# Patient Record
Sex: Female | Born: 1990 | Race: White | Hispanic: No | Smoking: Current every day smoker
Health system: Southern US, Community
[De-identification: ages and names within clinical notes are randomized; demographics above are authoritative.]

## PROBLEM LIST (undated history)

## (undated) DIAGNOSIS — F3281 Premenstrual dysphoric disorder: Secondary | ICD-10-CM

## (undated) DIAGNOSIS — F419 Anxiety disorder, unspecified: Secondary | ICD-10-CM

## (undated) DIAGNOSIS — F4310 Post-traumatic stress disorder, unspecified: Secondary | ICD-10-CM

## (undated) DIAGNOSIS — F3110 Bipolar disorder, current episode manic without psychotic features, unspecified: Secondary | ICD-10-CM

## (undated) DIAGNOSIS — A630 Anogenital (venereal) warts: Secondary | ICD-10-CM

## (undated) DIAGNOSIS — F1910 Other psychoactive substance abuse, uncomplicated: Secondary | ICD-10-CM

## (undated) DIAGNOSIS — F329 Major depressive disorder, single episode, unspecified: Secondary | ICD-10-CM

## (undated) DIAGNOSIS — F32A Depression, unspecified: Secondary | ICD-10-CM

## (undated) DIAGNOSIS — N390 Urinary tract infection, site not specified: Secondary | ICD-10-CM

## (undated) DIAGNOSIS — F5000 Anorexia nervosa, unspecified: Secondary | ICD-10-CM

## (undated) HISTORY — DX: Anxiety disorder, unspecified: F41.9

## (undated) HISTORY — DX: Major depressive disorder, single episode, unspecified: F32.9

## (undated) HISTORY — DX: Premenstrual dysphoric disorder: F32.81

## (undated) HISTORY — DX: Other psychoactive substance abuse, uncomplicated: F19.10

## (undated) HISTORY — DX: Depression, unspecified: F32.A

## (undated) HISTORY — DX: Anorexia nervosa, unspecified: F50.00

## (undated) HISTORY — DX: Bipolar disorder, current episode manic without psychotic features, unspecified: F31.10

## (undated) HISTORY — DX: Anogenital (venereal) warts: A63.0

## (undated) HISTORY — DX: Urinary tract infection, site not specified: N39.0

## (undated) HISTORY — DX: Post-traumatic stress disorder, unspecified: F43.10

## (undated) HISTORY — PX: APPENDECTOMY: SHX54

---

## 2010-05-23 ENCOUNTER — Emergency Department (HOSPITAL_COMMUNITY): Payer: PPO

## 2010-05-23 ENCOUNTER — Other Ambulatory Visit: Payer: Self-pay | Admitting: General Surgery

## 2010-05-23 ENCOUNTER — Ambulatory Visit (HOSPITAL_COMMUNITY)
Admission: EM | Admit: 2010-05-23 | Discharge: 2010-05-24 | Disposition: A | Discharge status: Home / Self Care | Payer: PPO | Attending: Surgery | Admitting: Surgery

## 2010-05-23 DIAGNOSIS — E876 Hypokalemia: Secondary | ICD-10-CM | POA: Insufficient documentation

## 2010-05-23 DIAGNOSIS — R1031 Right lower quadrant pain: Secondary | ICD-10-CM | POA: Insufficient documentation

## 2010-05-23 DIAGNOSIS — Z01812 Encounter for preprocedural laboratory examination: Secondary | ICD-10-CM | POA: Insufficient documentation

## 2010-05-23 DIAGNOSIS — K3580 Unspecified acute appendicitis: Secondary | ICD-10-CM | POA: Insufficient documentation

## 2010-05-23 DIAGNOSIS — F17200 Nicotine dependence, unspecified, uncomplicated: Secondary | ICD-10-CM | POA: Insufficient documentation

## 2010-05-23 DIAGNOSIS — K219 Gastro-esophageal reflux disease without esophagitis: Secondary | ICD-10-CM | POA: Insufficient documentation

## 2010-05-23 LAB — CBC
HCT: 39.6 % (ref 36.0–46.0)
Hemoglobin: 13.6 g/dL (ref 12.0–15.0)
MCHC: 34.3 g/dL (ref 30.0–36.0)
MCV: 90.8 fL (ref 78.0–100.0)
RDW: 11.9 % (ref 11.5–15.5)
WBC: 11.3 10*3/uL — ABNORMAL HIGH (ref 4.0–10.5)

## 2010-05-23 LAB — COMPREHENSIVE METABOLIC PANEL
ALT: 12 U/L (ref 0–35)
AST: 18 U/L (ref 0–37)
Alkaline Phosphatase: 64 U/L (ref 39–117)
CO2: 29 mEq/L (ref 19–32)
Calcium: 10 mg/dL (ref 8.4–10.5)
Chloride: 99 mEq/L (ref 96–112)
GFR calc non Af Amer: >60 mL/min (ref >60)
Glucose, Bld: 103 mg/dL — ABNORMAL HIGH (ref 70–99)
Sodium: 138 mEq/L (ref 135–145)
Total Bilirubin: 0.2 mg/dL — ABNORMAL LOW (ref 0.3–1.2)

## 2010-05-23 LAB — DIFFERENTIAL
Eosinophils Relative: 1 % (ref 0–5)
Lymphocytes Relative: 12 % (ref 12–46)
Lymphs Abs: 1.4 10*3/uL (ref 0.7–4.0)
Monocytes Absolute: 0.6 10*3/uL (ref 0.1–1.0)

## 2010-05-23 LAB — URINALYSIS, ROUTINE W REFLEX MICROSCOPIC
Bilirubin Urine: NEGATIVE
Glucose, UA: NEGATIVE mg/dL
Hgb urine dipstick: NEGATIVE
Specific Gravity, Urine: 1.02 (ref 1.005–1.030)
Urobilinogen, UA: 0.2 mg/dL (ref 0.0–1.0)

## 2010-05-23 MED ORDER — IOHEXOL 300 MG/ML  SOLN
100.0000 mL | Freq: Once | INTRAMUSCULAR | Status: AC | PRN
  Administered 2010-05-23: 100 mL via INTRAVENOUS

## 2010-05-29 NOTE — H&P (Signed)
NAME:  Tonya Le, Tonya Le            ACCOUNT NO.:  [id number]  MEDICAL RECORD NO.:  [id number]           PATIENT TYPE:  O  LOCATION:  1537                         FACILITY:  [facility]  PHYSICIAN:  Angelia Mould. Derrell Lolling, M.D.DATE OF BIRTH:  [birth year]  DATE OF ADMISSION:  05/23/2010 DATE OF DISCHARGE:                             HISTORY & PHYSICAL   Primary care physician is in [place].  CHIEF COMPLAINT:  Right lower quadrant abdominal pain.  HISTORY OF PRESENT ILLNESS:  Tonya Le is a 20 year old white female who has a past medical history of gastroesophageal reflux disease as well as possible hypothyroidism who began having bilateral lower quadrant abdominal pain on Monday.  She describes this as a deep, annoying, sharp pain that has been constant since Monday.  It has progressively worsened over the last several days.  She did admit to some chills, but no fever.  She denies any nausea or vomiting and has had normal bowel movements.  Due to worsening abdominal pain, the patient presented to the emergency department today where she had further workup and evaluation.  She was found to have a white blood cell count of 11,300.  She had a CT scan of the abdomen and pelvis due to a localization of abdominal pain in the right lower quadrant that revealed acute appendicitis with a dilatation of the appendix at 1.2 cm with periappendiceal stranding.  Because of this finding, we were asked to evaluate the patient for surgical admission.  REVIEW OF SYSTEMS:  Please see HPI, otherwise all other systems have been reviewed and are negative.  FAMILY HISTORY:  Noncontributory.  PAST MEDICAL HISTORY: 1. GERD. 2. Possible hypothyroidism, not treated with medication.  PAST SURGICAL HISTORY:  None.  SOCIAL HISTORY:  The patient is a Consulting civil engineer at [employer].  She smokes a little bit less than a pack a day of cigarettes.  She admits to use an occasional marijuana, but denies any other  illicit drug abuse. She admits to drinking anywhere from 3-9 beers a day.  ALLERGIES:  NKDA.  MEDICATIONS:  Tonya Le birth control.  PHYSICAL EXAMINATION:  GENERAL:  Tonya Le is a very pleasant, well-developed, well-nourished 20 year old white female who is currently lying in bed in no acute distress. VITAL SIGNS:  Temperature 98.2, blood pressure 114/82, pulse 71, respirations 20. HEENT:  Head is normocephalic, atraumatic.  Sclerae noninjected.  Pupils are equal, round and reactive to light.  Ears and nose, without any obvious masses or lesions.  No rhinorrhea.  Mouth is pink.  Throat shows no exudate. HEART:  Regular rate and rhythm.  Normal S1, S2.  No murmurs, gallops, or rubs are noted.  She does have palpable carotid, radial, and pedal pulses bilaterally. LUNGS:  Clear to auscultation bilaterally with no wheezes, rhonchi, or rales noted. RESPIRATORY:  Effort is nonlabored. ABDOMEN:  Soft, tender in the right lower quadrant, otherwise nontender. She does have some voluntary guarding.  She does have active bowel sounds and is otherwise nondistended.  No masses, hernias, or organomegaly are noted.  MUSCULOSKELETAL:  All 4 extremities are symmetrical with no cyanosis, clubbing, or edema. SKIN:  Warm and dry with no  mass or lesions or rashes. PSYCH:  The patient is alert, oriented x3 with an appropriate affect.  LABORATORY DATA/DIAGNOSTICS:  White blood cell count of 11,300, hemoglobin 13.6, hematocrit 39.6, platelet count is 171,000.  Sodium 138, potassium 3.3, glucose 103, BUN 6, creatinine 0.59.  CT scan of the abdomen and pelvis reveals a 1.2 cm thickened appendix with periappendiceal stranding, but no definite phlegm on her abscess.  IMPRESSION: 1. Acute appendicitis. 2. Gastroesophageal reflux disease. 3. Possible hypothyroidism. 4. Hypokalemia.  PLAN:  At this time, we will get the patient admitted.  Dr. Derrell Lolling has evaluated the patient as well as reviewed  the CT scan.  It is felt the patient needs admission for IV antibiotics and IV fluids as well as taken to the operating room for a laparoscopic appendectomy.  We have explained this procedure to her as well as the risks and complications such as bleeding, infection, or risk of injury to structure not meant to injure.  The patient who is accompanied by her friend understands and is agreeable to proceed with surgical intervention.  We will replace the patient's potassium with potassium in her IV fluids.     Letha Cape, PA   ______________________________ Angelia Mould. Derrell Lolling, M.D.    KEO/MEDQ  D:  05/23/2010  T:  05/24/2010  Job:  [id number]  Electronically Signed by Barnetta Chapel PA on 05/28/2010 02:31:08 PM Electronically Signed by Claud Kelp M.D. on 05/29/2010 10:01:02 AM

## 2010-06-11 NOTE — Op Note (Signed)
NAME:  Tonya Le, Tonya Le            ACCOUNT NO.:  [id number]  MEDICAL RECORD NO.:  [id number]           PATIENT TYPE:  O  LOCATION:  1537                         FACILITY:  [facility]  PHYSICIAN:  Lennie Muckle, MD      DATE OF BIRTH:  March 08, 1990  DATE OF PROCEDURE:  05/23/2010 DATE OF DISCHARGE:                              OPERATIVE REPORT   PREOPERATIVE DIAGNOSIS:  Acute appendicitis.  POSTOPERATIVE DIAGNOSIS:  Acute appendicitis.  PROCEDURE:  Laparoscopic appendectomy.  ESTIMATED BLOOD LOSS:  Minimal.  ANESTHESIA:  General.  SPECIMENS:  Appendix.  FINDINGS:  Thickened appendix to the base.  There was no purulent fluid within the pelvis.  She also was noted to have a right inguinal hernia. No immediate complications.  INDICATIONS FOR PROCEDURE:  Ms. Zollinger is a 20 year old female who had onset of abdominal pain today.  She was evaluated in the emergency department and noted to have slightly elevated white count.  CT scan revealed thickened appendix consistent with acute appendicitis.  She is generally seen by Dr. Claud Kelp.  I saw the patient and agreed with the assessment and the need for appendectomy.  She was seen in the preoperative holding area.  She did receive Zosyn prior to going to the operating room.  DETAILS OF PROCEDURE:  After discussing the procedure with the patient and friends who were in the preoperative holding area, she was taken to the operating room by Anesthesia.  She was placed in the supine position.  A Foley catheter was placed.  Her abdomen was then prepped and draped in the usual sterile fashion.  Sequential compression devices had already been applied to her lower extremity.  Surgical time-out was performed, and I placed an incision in the right upper quadrant using a 5-mm camera, placed the trocar into the abdominal cavity.  All layers of the abdominal wall were visualized upon entry.  After insufflating the abdomen, I inspected the  abdomen and found no evidence of injury upon placement of the trocar.  I placed an additional 5-mm trocar at the umbilical region.  A 12-mm trocar was placed in the suprapubic region. The appendix was visualized in the right lower quadrant.  It was somewhat distended.  Using the harmonic scalpel, I divided the mesoappendix.  Once I divided the mesoappendix, I mobilized the cecum and the appendix medially by dissecting along the line of Toldt.  Once I mobilized the cecum and the appendix, I continued dissecting trying to get towards the base of the appendix.  The appendix was enlarged.  It was also thickened at the base.  I continued dissecting until I felt I was as close to the cecum as I could get without injury to the cecum.  I then placed a laparoscopic GIA stapler across the base of the appendix. I attempted to incorporate a small amount of the cecum.  Once I stapled across the appendix, I placed the EndoCatch bag.  I irrigated the abdomen with 2 liters of saline.  The staple line was intact.  I irrigated the pelvis.  I found a right inguinal hernia.  The ovaries were normal, uterus was normal,  liver and gallbladder were normal. After irrigating the abdomen, I closed the fascial defect at the suprapubic region using a 0 Vicryl suture.  I then did one final inspection, found no bleeding and no evidence of injury.  I then released pneumoperitoneum, removed the trocar.  Skin was closed with 4-0 Monocryl.  Dermabond was placed as final dressing.  The patient was extubated, transferred to postanesthesia care unit in stable condition to be discharged home tomorrow.     Lennie Muckle, MD     ALA/MEDQ  D:  05/23/2010  T:  05/24/2010  Job:  [id number]  Electronically Signed by Bertram Savin MD on 06/11/2010 10:41:58 AM

## 2014-07-11 ENCOUNTER — Encounter: Payer: Self-pay | Admitting: Family

## 2014-07-11 ENCOUNTER — Ambulatory Visit (INDEPENDENT_AMBULATORY_CARE_PROVIDER_SITE_OTHER): Payer: 59 | Admitting: Family

## 2014-07-11 VITALS — BP 124/88 | HR 93 | Temp 98.3°F | Resp 18 | Ht 63.5 in | Wt 132.8 lb

## 2014-07-11 DIAGNOSIS — F509 Eating disorder, unspecified: Secondary | ICD-10-CM | POA: Diagnosis not present

## 2014-07-11 DIAGNOSIS — Z3041 Encounter for surveillance of contraceptive pills: Secondary | ICD-10-CM | POA: Diagnosis not present

## 2014-07-11 DIAGNOSIS — F325 Major depressive disorder, single episode, in full remission: Secondary | ICD-10-CM | POA: Insufficient documentation

## 2014-07-11 DIAGNOSIS — F32A Depression, unspecified: Secondary | ICD-10-CM

## 2014-07-11 DIAGNOSIS — F329 Major depressive disorder, single episode, unspecified: Secondary | ICD-10-CM | POA: Diagnosis not present

## 2014-07-11 MED ORDER — DROSPIRENONE-ETHINYL ESTRADIOL 3-0.02 MG PO TABS: 1.0000 | ORAL_TABLET | Freq: Every day | ORAL | Status: DC

## 2014-07-11 NOTE — Assessment & Plan Note (Signed)
Previously maintained on drospiernone-ethynil estradiol. Takes her medication as prescribed and denies adverse side effects. Last menstrual cycle in middle of May. Has not missed any doses of birth control. Discussed other birth control methods including intrauterine device and Nexplanon. Continue current dosage of drospirenone-ethinyl estradiol.

## 2014-07-11 NOTE — Progress Notes (Signed)
Pre visit review using our clinic review tool, if applicable. No additional management support is needed unless otherwise documented below in the visit note. 

## 2014-07-11 NOTE — Assessment & Plan Note (Signed)
Stable and not currently maintained on medications. Denies any suicidal ideations. Indicates that her symptoms continue to wax and wane. Discussed potential medications, and would like to wait to discuss with psychiatry. She will investigate potential psychiatrists and notify the office for referral.

## 2014-07-11 NOTE — Patient Instructions (Addendum)
Thank you for choosing [facility].  Summary/Instructions:  Please continue to take your birth control as prescribed.  Please schedule a time for a physical at your convenience.  Please call with the name of a potential psychiatrist.   Your prescription(s) have been submitted to your pharmacy or been printed and provided for you. Please take as directed and contact our office if you believe you are having problem(s) with the medication(s) or have any questions.  Intrauterine Device Information An intrauterine device (IUD) is inserted into your uterus to prevent pregnancy. There are two types of IUDs available:   Copper IUD--This type of IUD is wrapped in copper wire and is placed inside the uterus. Copper makes the uterus and fallopian tubes produce a fluid that kills sperm. The copper IUD can stay in place for 10 years.  Hormone IUD--This type of IUD contains the hormone progestin (synthetic progesterone). The hormone thickens the cervical mucus and prevents sperm from entering the uterus. It also thins the uterine lining to prevent implantation of a fertilized egg. The hormone can weaken or kill the sperm that get into the uterus. One type of hormone IUD can stay in place for 5 years, and another type can stay in place for 3 years. Your health care provider will make sure you are a good candidate for a contraceptive IUD. Discuss with your health care provider the possible side effects.  ADVANTAGES OF AN INTRAUTERINE DEVICE  IUDs are highly effective, reversible, long acting, and low maintenance.   There are no estrogen-related side effects.   An IUD can be used when breastfeeding.   IUDs are not associated with weight gain.   The copper IUD works immediately after insertion.   The hormone IUD works right away if inserted within 7 days of your period starting. You will need to use a backup method of birth control for 7 days if the hormone IUD is inserted at any other time  in your cycle.  The copper IUD does not interfere with your female hormones.   The hormone IUD can make heavy menstrual periods lighter and decrease cramping.   The hormone IUD can be used for 3 or 5 years.   The copper IUD can be used for 10 years. DISADVANTAGES OF AN INTRAUTERINE DEVICE  The hormone IUD can be associated with irregular bleeding patterns.   The copper IUD can make your menstrual flow heavier and more painful.   You may experience cramping and vaginal bleeding after insertion.  Document Released: 12/26/2003 Document Revised: 09/23/2012 Document Reviewed: 07/12/2012 [facility] Patient Information 2015 [place], [place]. This information is not intended to replace advice given to you by your health care provider. Make sure you discuss any questions you have with your health care provider.  Etonogestrel implant What is this medicine? ETONOGESTREL (et oh noe JES trel) is a contraceptive (birth control) device. It is used to prevent pregnancy. It can be used for up to 3 years. This medicine may be used for other purposes; ask your health care provider or pharmacist if you have questions. COMMON BRAND NAME(S): Implanon, Nexplanon What should I tell my health care provider before I take this medicine? They need to know if you have any of these conditions: -abnormal vaginal bleeding -blood vessel disease or blood clots -cancer of the breast, cervix, or liver -depression -diabetes -gallbladder disease -headaches -heart disease or recent heart attack -high blood pressure -high cholesterol -kidney disease -liver disease -renal disease -seizures -tobacco smoker -an unusual or allergic  reaction to etonogestrel, other hormones, anesthetics or antiseptics, medicines, foods, dyes, or preservatives -pregnant or trying to get pregnant -breast-feeding How should I use this medicine? This device is inserted just under the skin on the inner side of your upper arm by a  health care professional. Talk to your pediatrician regarding the use of this medicine in children. Special care may be needed. Overdosage: If you think you've taken too much of this medicine contact a poison control center or emergency room at once. Overdosage: If you think you have taken too much of this medicine contact a poison control center or emergency room at once. NOTE: This medicine is only for you. Do not share this medicine with others. What if I miss a dose? This does not apply. What may interact with this medicine? Do not take this medicine with any of the following medications: -amprenavir -bosentan -fosamprenavir This medicine may also interact with the following medications: -barbiturate medicines for inducing sleep or treating seizures -certain medicines for fungal infections like ketoconazole and itraconazole -griseofulvin -medicines to treat seizures like carbamazepine, felbamate, oxcarbazepine, phenytoin, topiramate -modafinil -phenylbutazone -rifampin -some medicines to treat HIV infection like atazanavir, indinavir, lopinavir, nelfinavir, tipranavir, ritonavir -St. John's wort This list may not describe all possible interactions. Give your health care provider a list of all the medicines, herbs, non-prescription drugs, or dietary supplements you use. Also tell them if you smoke, drink alcohol, or use illegal drugs. Some items may interact with your medicine. What should I watch for while using this medicine? This product does not protect you against HIV infection (AIDS) or other sexually transmitted diseases. You should be able to feel the implant by pressing your fingertips over the skin where it was inserted. Tell your doctor if you cannot feel the implant. What side effects may I notice from receiving this medicine? Side effects that you should report to your doctor or health care professional as soon as possible: -allergic reactions like skin rash, itching or  hives, swelling of the face, lips, or tongue -breast lumps -changes in vision -confusion, trouble speaking or understanding -dark urine -depressed mood -general ill feeling or flu-like symptoms -light-colored stools -loss of appetite, nausea -right upper belly pain -severe headaches -severe pain, swelling, or tenderness in the abdomen -shortness of breath, chest pain, swelling in a leg -signs of pregnancy -sudden numbness or weakness of the face, arm or leg -trouble walking, dizziness, loss of balance or coordination -unusual vaginal bleeding, discharge -unusually weak or tired -yellowing of the eyes or skin Side effects that usually do not require medical attention (Report these to your doctor or health care professional if they continue or are bothersome.): -acne -breast pain -changes in weight -cough -fever or chills -headache -irregular menstrual bleeding -itching, burning, and vaginal discharge -pain or difficulty passing urine -sore throat This list may not describe all possible side effects. Call your doctor for medical advice about side effects. You may report side effects to FDA at 1-800-FDA-1088. Where should I keep my medicine? This drug is given in a hospital or clinic and will not be stored at home. NOTE: This sheet is a summary. It may not cover all possible information. If you have questions about this medicine, talk to your doctor, pharmacist, or health care provider.  2015, [employer]vier/[employer] Standard. (2011-07-29 15:37:45)

## 2014-07-11 NOTE — Progress Notes (Signed)
Subjective:    Patient ID: Tonya Le, female    DOB: 12-01-1990, 24 y.o.   MRN: [id number]  Chief Complaint  Patient presents with  . Establish Care    Referrals,     HPI:  Tonya Le is a 24 y.o. female with a PMH of substance abuse, depression, anorexia nervosa, and urinary tract infections who presents today for an office visit to establish care.   1.) Depression - previously diagnosed with polysubstance abuse and depression. Associated symptom depression has been going on for her whole life. Modifying factors include substance abuse in attempts to improve her feelings. Has taken prozac which did not help very much. Severity of the symptoms vary and occasionally effect her lifestyle. Would like a referral to psychiatry after she investigates potential psychiatrists.  2.) Birth control - Currently maintained on the Yaz. Takes the medication as prescribed and denies any adverse side effects of the medication. Indicates her last period was in mid-May.   3.) Anorexia nervosa - previously diagnosed with anorexia nervosa and currently in remission. Indicates she is eating 2-3 meals per day and maintaining her weight. Not currently in treatment at this time.   No Known Allergies   No outpatient prescriptions prior to visit.   No facility-administered medications prior to visit.     Past Medical History  Diagnosis Date  . Depression   . Anxiety   . PTSD (post-traumatic stress disorder)   . Anorexia nervosa   . Genital warts   . UTI (urinary tract infection)   . Substance abuse     multiple     Past Surgical History  Procedure Laterality Date  . Appendectomy       Family History  Problem Relation Age of Onset  . Heart attack Mother   . Early death Mother   . Mental illness Mother   . Alcohol abuse Maternal Grandmother   . Stroke Maternal Grandmother   . Bipolar disorder Maternal Grandmother   . Alcohol abuse Maternal Grandfather   . Lung cancer  Maternal Grandfather   . Alcohol abuse Paternal Grandmother   . Alcohol abuse Paternal Grandfather   . Hypertension Paternal Grandfather      History   Social History  . Marital Status: Single    Spouse Name: N/A  . Number of Children: 0  . Years of Education: 16   Occupational History  . Server    Social History Main Topics  . Smoking status: Current Every Day Smoker -- 0.33 packs/day for 10 years  . Smokeless tobacco: Never Used  . Alcohol Use: 4.2 - 8.4 oz/week    0 Standard drinks or equivalent, 7-14 Cans of beer per week  . Drug Use: No  . Sexual Activity: Yes    Birth Control/ Protection: Pill   Other Topics Concern  . Not on file   Social History Narrative   Fun: Sleep, play her dog   Denies religious beliefs effecting health care.   Originally from [place].      Review of Systems  Constitutional: Negative for fever and chills.  Genitourinary: Negative for menstrual problem.  Psychiatric/Behavioral: Positive for dysphoric mood. Negative for suicidal ideas.      Objective:    BP 124/88 mmHg  Pulse 93  Temp(Src) 98.3 F (36.8 C) (Oral)  Resp 18  Ht 5' 3.5" (1.613 m)  Wt 132 lb 12.8 oz (60.238 kg)  BMI 23.15 kg/m2  SpO2 97% Nursing note and vital signs reviewed.  Physical Exam  Constitutional: She is oriented to person, place, and time. She appears well-developed and well-nourished. No distress.  Cardiovascular: Normal rate, regular rhythm, normal heart sounds and intact distal pulses.   Pulmonary/Chest: Effort normal and breath sounds normal.  Neurological: She is alert and oriented to person, place, and time.  Skin: Skin is warm and dry.  Psychiatric: She has a normal mood and affect. Her behavior is normal. Judgment and thought content normal.       Assessment & Plan:   Problem List Items Addressed This Visit      Other   Encounter for birth control pills maintenance    Previously maintained on drospiernone-ethynil estradiol. Takes her  medication as prescribed and denies adverse side effects. Last menstrual cycle in middle of May. Has not missed any doses of birth control. Discussed other birth control methods including intrauterine device and Nexplanon. Continue current dosage of drospirenone-ethinyl estradiol.       Relevant Medications   drospirenone-ethinyl estradiol (YAZ,GIANVI,LORYNA) 3-0.02 MG tablet   Depression - Primary    Stable and not currently maintained on medications. Denies any suicidal ideations. Indicates that her symptoms continue to wax and wane. Discussed potential medications, and would like to wait to discuss with psychiatry. She will investigate potential psychiatrists and notify the office for referral.      Eating disorder    Stable currently with no changes in weight and averaging 2-3 meals per day. Follow-up as needed and with psychiatry.

## 2014-07-11 NOTE — Assessment & Plan Note (Signed)
Stable currently with no changes in weight and averaging 2-3 meals per day. Follow-up as needed and with psychiatry.

## 2014-10-28 ENCOUNTER — Other Ambulatory Visit: Payer: 59

## 2014-10-28 ENCOUNTER — Encounter: Payer: Self-pay | Admitting: Internal Medicine

## 2014-10-28 ENCOUNTER — Ambulatory Visit (INDEPENDENT_AMBULATORY_CARE_PROVIDER_SITE_OTHER): Payer: 59 | Admitting: Internal Medicine

## 2014-10-28 ENCOUNTER — Encounter: Payer: Self-pay | Admitting: Emergency Medicine

## 2014-10-28 VITALS — BP 132/74 | HR 91 | Temp 98.9°F | Resp 18 | Wt 137.0 lb

## 2014-10-28 DIAGNOSIS — J029 Acute pharyngitis, unspecified: Secondary | ICD-10-CM

## 2014-10-28 DIAGNOSIS — J020 Streptococcal pharyngitis: Secondary | ICD-10-CM

## 2014-10-28 MED ORDER — AMOXICILLIN 500 MG PO CAPS
500.0000 mg | ORAL_CAPSULE | Freq: Three times a day (TID) | ORAL | Status: DC
Start: 2014-10-28 — End: 2015-03-08

## 2014-10-28 NOTE — Progress Notes (Signed)
Pre visit review using our clinic review tool, if applicable. No additional management support is needed unless otherwise documented below in the visit note. 

## 2014-10-28 NOTE — Patient Instructions (Signed)
Zicam Melts or Zinc lozenges as per package label for sore throat .  Complementary options to boost immunity include  vitamin C 2000 mg daily; & Echinacea for 4-7 days.     Plain Mucinex (NOT D) for thick secretions ;force NON dairy fluids .   Nasal cleansing in the shower as discussed with lather of mild shampoo.After 10 seconds wash off lather while  exhaling through nostrils. Make sure that all residual soap is removed to prevent irritation.  Flonase OR Nasacort AQ 1 spray in each nostril twice a day as needed. Use the "crossover" technique into opposite nostril spraying toward opposite ear @ 45 degree angle, not straight up into nostril.  Plain Allegra (NOT D )  160 daily , Loratidine 10 mg , OR Zyrtec 10 mg @ bedtime  as needed for itchy eyes & sneezing. 

## 2014-10-28 NOTE — Progress Notes (Signed)
   Subjective:    Patient ID: Tonya Le, female    DOB: 29-Apr-1990, 24 y.o.   MRN: [id number]  HPI Her symptoms began 3 days ago as sore throat with pain up to level VIII. She states it's the worse sore throat she's ever had. She noted some tenderness in the cervical lymph node areas. She been taking ibuprofen 800 mg 3-4 times a day with food.hroat in the past. She has a history of having had strep throat.  Review of Systems She denies associated upper respiratory tract infection symptoms of frontal headache, facial pain , nasal purulence, dental pain, , otic pain or otic discharge. No fever , chills or sweats.   After taking the NSAIDs she's had some intermittent lower abdominal discomfort .She has a history of chronic reflux.  She's had no blood in the stool or urine. There has been no melanotic stools. She has had some dysphagia with hard foods such as peanuts but not with fluids.      Objective:   Physical Exam  She has minimal erythema of the nasal mucosa. She has bilateral cervical lymphadenopathy, more prominent on the left as a "string of pearls". There is exudate over the left posterior tonsil. Tonsils are mildly enlarged. She has scattered tattoos. She has an earring through the right eyebrow.  General appearance:Adequately nourished; no acute distress or increased work of breathing is present.    Lymphatic: No  lymphadenopathy in axilla .  Eyes: No conjunctival inflammation or lid edema is present. There is no scleral icterus.  Ears:  External ear exam shows no significant lesions or deformities.  Otoscopic examination reveals clear canals, tympanic membranes are intact bilaterally without bulging, retraction, inflammation or discharge.  Nose:  External nasal examination shows no deformity or inflammation. No septal dislocation or deviation.No obstruction to airflow.   Oral exam: Dental hygiene is good; lips and gums are healthy appearing.  Neck:  No deformities,  thyromegaly, masses, or tenderness noted.   Supple with full range of motion without pain.   Heart:  Normal rate and regular rhythm. S1 and S2 normal without gallop, murmur, click, rub or other extra sounds.   Lungs:Chest clear to auscultation; no wheezes, rhonchi,rales ,or rubs present.  Abdomen: bowel sounds normal, soft and non-tender without masses, organomegaly or hernias noted.  No guarding or rebound  Extremities:  No cyanosis, edema, or clubbing  noted   Skin: Warm & dry w/o tenting or jaundice. No significant lesions or rash.     Assessment & Plan:  #1 pharyngitis with exudate, cervical lymphadenopathy, absence of cough and possible fever. High suspicion for strep throat.  Plan: See orders & recommendations

## 2014-10-29 LAB — CULTURE, GROUP A STREP: ORGANISM ID, BACTERIA: NORMAL

## 2014-10-31 ENCOUNTER — Telehealth: Payer: Self-pay | Admitting: Emergency Medicine

## 2014-10-31 NOTE — Telephone Encounter (Signed)
LVM for pt to call back regarding throat culture results.

## 2015-01-18 ENCOUNTER — Telehealth: Payer: Self-pay

## 2015-01-18 NOTE — Telephone Encounter (Signed)
Outreach to fup on flu shot; has not had it but will get it at a walk in clinic or will stop by when home for Christmas.

## 2015-03-08 ENCOUNTER — Ambulatory Visit (INDEPENDENT_AMBULATORY_CARE_PROVIDER_SITE_OTHER): Payer: BLUE CROSS/BLUE SHIELD | Admitting: Family

## 2015-03-08 ENCOUNTER — Encounter: Payer: Self-pay | Admitting: Family

## 2015-03-08 VITALS — BP 120/84 | HR 94 | Temp 98.6°F | Resp 18 | Ht 63.5 in | Wt 140.8 lb

## 2015-03-08 DIAGNOSIS — F17209 Nicotine dependence, unspecified, with unspecified nicotine-induced disorders: Secondary | ICD-10-CM

## 2015-03-08 DIAGNOSIS — Z3041 Encounter for surveillance of contraceptive pills: Secondary | ICD-10-CM | POA: Diagnosis not present

## 2015-03-08 DIAGNOSIS — Z0000 Encounter for general adult medical examination without abnormal findings: Secondary | ICD-10-CM | POA: Diagnosis not present

## 2015-03-08 DIAGNOSIS — Z720 Tobacco use: Secondary | ICD-10-CM

## 2015-03-08 DIAGNOSIS — Z716 Tobacco abuse counseling: Secondary | ICD-10-CM | POA: Diagnosis not present

## 2015-03-08 LAB — POCT URINE PREGNANCY: PREG TEST UR: NEGATIVE

## 2015-03-08 MED ORDER — DROSPIRENONE-ETHINYL ESTRADIOL 3-0.02 MG PO TABS: 1.0000 | ORAL_TABLET | Freq: Every day | ORAL | Status: DC

## 2015-03-08 NOTE — Assessment & Plan Note (Signed)
1) Anticipatory Guidance: Discussed importance of wearing a seatbelt while driving and not texting while driving; changing batteries in smoke detector at least once annually; wearing suntan lotion when outside; eating a balanced and moderate diet; getting physical activity at least 30 minutes per day.  2) Immunizations / Screenings / Labs:  Declines influenza. All other immunizations are up-to-date per recommendations. Due for a dental and vision screen encouraged to be completed independently. All other screenings are up-to-date per recommendations. Obtain CBC, CMET, Lipid profile and TSH when fasting.   Overall well exam with risk factors for cardiovascular disease including tobacco use. Counseled for approximately 5 minutes regarding tobacco cessation and importance of reducing risk for future cardiovascular and pulmonary disease. Discussed potential use of patches, gums, and medication. Patient wishes to hold off at this time. Smoking cessation information provided and after visit summary. Recommended continue adequate nutrition intake with a balanced, married, and moderate diet that is focused on nutrient dense foods. Continue physical activity. She is of good weight. Continue other healthy lifestyle behaviors and choices. Follow-up prevention exam in 1 year. Follow-up office visit pending blood work as necessary.

## 2015-03-08 NOTE — Progress Notes (Signed)
Pre visit review using our clinic review tool, if applicable. No additional management support is needed unless otherwise documented below in the visit note. 

## 2015-03-08 NOTE — Patient Instructions (Addendum)
Thank you for choosing [facility].  Summary/Instructions:  Your prescription(s) have been submitted to your pharmacy or been printed and provided for you. Please take as directed and contact our office if you believe you are having problem(s) with the medication(s) or have any questions.   Please stop by the lab on the basement level of the building for your blood work. Your results will be released to MyChart (or called to you) after review, usually within 72 hours after test completion. If any changes need to be made, you will be notified at that same time.  Smoking Cessation, Tips for Success If you are ready to quit smoking, congratulations! You have chosen to help yourself be healthier. Cigarettes bring nicotine, tar, carbon monoxide, and other irritants into your body. Your lungs, heart, and blood vessels will be able to work better without these poisons. There are many different ways to quit smoking. Nicotine gum, nicotine patches, a nicotine inhaler, or nicotine nasal spray can help with physical craving. Hypnosis, support groups, and medicines help break the habit of smoking. WHAT THINGS CAN I DO TO MAKE QUITTING EASIER?  Here are some tips to help you quit for good:  Pick a date when you will quit smoking completely. Tell all of your friends and family about your plan to quit on that date.  Do not try to slowly cut down on the number of cigarettes you are smoking. Pick a quit date and quit smoking completely starting on that day.  Throw away all cigarettes.   Clean and remove all ashtrays from your home, work, and car.  On a card, write down your reasons for quitting. Carry the card with you and read it when you get the urge to smoke.  Cleanse your body of nicotine. Drink enough water and fluids to keep your urine clear or pale yellow. Do this after quitting to flush the nicotine from your body.  Learn to predict your moods. Do not let a bad situation be your excuse to  have a cigarette. Some situations in your life might tempt you into wanting a cigarette.  Never have "just one" cigarette. It leads to wanting another and another. Remind yourself of your decision to quit.  Change habits associated with smoking. If you smoked while driving or when feeling stressed, try other activities to replace smoking. Stand up when drinking your coffee. Brush your teeth after eating. Sit in a different chair when you read the paper. Avoid alcohol while trying to quit, and try to drink fewer caffeinated beverages. Alcohol and caffeine may urge you to smoke.  Avoid foods and drinks that can trigger a desire to smoke, such as sugary or spicy foods and alcohol.  Ask people who smoke not to smoke around you.  Have something planned to do right after eating or having a cup of coffee. For example, plan to take a walk or exercise.  Try a relaxation exercise to calm you down and decrease your stress. Remember, you may be tense and nervous for the first 2 weeks after you quit, but this will pass.  Find new activities to keep your hands busy. Play with a pen, coin, or rubber band. Doodle or draw things on paper.  Brush your teeth right after eating. This will help cut down on the craving for the taste of tobacco after meals. You can also try mouthwash.   Use oral substitutes in place of cigarettes. Try using lemon drops, carrots, cinnamon sticks, or chewing gum. Keep  them handy so they are available when you have the urge to smoke.  When you have the urge to smoke, try deep breathing.  Designate your home as a nonsmoking area.  If you are a heavy smoker, ask your health care provider about a prescription for nicotine chewing gum. It can ease your withdrawal from nicotine.  Reward yourself. Set aside the cigarette money you save and buy yourself something nice.  Look for support from others. Join a support group or smoking cessation program. Ask someone at home or at work to  help you with your plan to quit smoking.  Always ask yourself, "Do I need this cigarette or is this just a reflex?" Tell yourself, "Today, I choose not to smoke," or "I do not want to smoke." You are reminding yourself of your decision to quit.  Do not replace cigarette smoking with electronic cigarettes (commonly called e-cigarettes). The safety of e-cigarettes is unknown, and some may contain harmful chemicals.  If you relapse, do not give up! Plan ahead and think about what you will do the next time you get the urge to smoke. HOW WILL I FEEL WHEN I QUIT SMOKING? You may have symptoms of withdrawal because your body is used to nicotine (the addictive substance in cigarettes). You may crave cigarettes, be irritable, feel very hungry, cough often, get headaches, or have difficulty concentrating. The withdrawal symptoms are only temporary. They are strongest when you first quit but will go away within 10-14 days. When withdrawal symptoms occur, stay in control. Think about your reasons for quitting. Remind yourself that these are signs that your body is healing and getting used to being without cigarettes. Remember that withdrawal symptoms are easier to treat than the major diseases that smoking can cause.  Even after the withdrawal is over, expect periodic urges to smoke. However, these cravings are generally short lived and will go away whether you smoke or not. Do not smoke! WHAT RESOURCES ARE AVAILABLE TO HELP ME QUIT SMOKING? Your health care provider can direct you to community resources or hospitals for support, which may include:  Group support.  Education.  Hypnosis.  Therapy.   This information is not intended to replace advice given to you by your health care provider. Make sure you discuss any questions you have with your health care provider.   Document Released: 10/20/2003 Document Revised: 02/11/2014 Document Reviewed: 07/09/2012 Elsevier Interactive Patient Education 2016  [employer].  Steps to Quit Smoking  Smoking tobacco can be harmful to your health and can affect almost every organ in your body. Smoking puts you, and those around you, at risk for developing many serious chronic diseases. Quitting smoking is difficult, but it is one of the best things that you can do for your health. It is never too late to quit. WHAT ARE THE BENEFITS OF QUITTING SMOKING? When you quit smoking, you lower your risk of developing serious diseases and conditions, such as:  Lung cancer or lung disease, such as COPD.  Heart disease.  Stroke.  Heart attack.  Infertility.  Osteoporosis and bone fractures. Additionally, symptoms such as coughing, wheezing, and shortness of breath may get better when you quit. You may also find that you get sick less often because your body is stronger at fighting off colds and infections. If you are pregnant, quitting smoking can help to reduce your chances of having a baby of low birth weight. HOW DO I GET READY TO QUIT? When you decide to quit smoking,  create a plan to make sure that you are successful. Before you quit:  Pick a date to quit. Set a date within the next two weeks to give you time to prepare.  Write down the reasons why you are quitting. Keep this list in places where you will see it often, such as on your bathroom mirror or in your car or wallet.  Identify the people, places, things, and activities that make you want to smoke (triggers) and avoid them. Make sure to take these actions:  Throw away all cigarettes at home, at work, and in your car.  Throw away smoking accessories, such as Set designer.  Clean your car and make sure to empty the ashtray.  Clean your home, including curtains and carpets.  Tell your family, friends, and coworkers that you are quitting. Support from your loved ones can make quitting easier.  Talk with your health care provider about your options for quitting smoking.  Find out  what treatment options are covered by your health insurance. WHAT STRATEGIES CAN I USE TO QUIT SMOKING?  Talk with your healthcare provider about different strategies to quit smoking. Some strategies include:  Quitting smoking altogether instead of gradually lessening how much you smoke over a period of time. Research shows that quitting "cold [place]" is more successful than gradually quitting.  Attending in-person counseling to help you build problem-solving skills. You are more likely to have success in quitting if you attend several counseling sessions. Even short sessions of 10 minutes can be effective.  Finding resources and support systems that can help you to quit smoking and remain smoke-free after you quit. These resources are most helpful when you use them often. They can include:  Online chats with a Veterinary surgeon.  Telephone quitlines.  Printed Materials engineer.  Support groups or group counseling.  Text messaging programs.  Mobile phone applications.  Taking medicines to help you quit smoking. (If you are pregnant or breastfeeding, talk with your health care provider first.) Some medicines contain nicotine and some do not. Both types of medicines help with cravings, but the medicines that include nicotine help to relieve withdrawal symptoms. Your health care provider may recommend:  Nicotine patches, gum, or lozenges.  Nicotine inhalers or sprays.  Non-nicotine medicine that is taken by mouth. Talk with your health care provider about combining strategies, such as taking medicines while you are also receiving in-person counseling. Using these two strategies together makes you more likely to succeed in quitting than if you used either strategy on its own. If you are pregnant or breastfeeding, talk with your health care provider about finding counseling or other support strategies to quit smoking. Do not take medicine to help you quit smoking unless told to do so by your  health care provider. WHAT THINGS CAN I DO TO MAKE IT EASIER TO QUIT? Quitting smoking might feel overwhelming at first, but there is a lot that you can do to make it easier. Take these important actions:  Reach out to your family and friends and ask that they support and encourage you during this time. Call telephone quitlines, reach out to support groups, or work with a counselor for support.  Ask people who smoke to avoid smoking around you.  Avoid places that trigger you to smoke, such as bars, parties, or smoke-break areas at work.  Spend time around people who do not smoke.  Lessen stress in your life, because stress can be a smoking trigger for some people. To  lessen stress, try:  Exercising regularly.  Deep-breathing exercises.  Yoga.  Meditating.  Performing a body scan. This involves closing your eyes, scanning your body from head to toe, and noticing which parts of your body are particularly tense. Purposefully relax the muscles in those areas.  Download or purchase mobile phone or tablet apps (applications) that can help you stick to your quit plan by providing reminders, tips, and encouragement. There are many free apps, such as QuitGuide from the [employer] developer for Disease Control and Prevention). You can find other support for quitting smoking (smoking cessation) through smokefree.gov and other websites. HOW WILL I FEEL WHEN I QUIT SMOKING? Within the first 24 hours of quitting smoking, you may start to feel some withdrawal symptoms. These symptoms are usually most noticeable 2-3 days after quitting, but they usually do not last beyond 2-3 weeks. Changes or symptoms that you might experience include:  Mood swings.  Restlessness, anxiety, or irritation.  Difficulty concentrating.  Dizziness.  Strong cravings for sugary foods in addition to nicotine.  Mild weight gain.  Constipation.  Nausea.  Coughing or a sore throat.  Changes in how your medicines work in  your body.  A depressed mood.  Difficulty sleeping (insomnia). After the first 2-3 weeks of quitting, you may start to notice more positive results, such as:  Improved sense of smell and taste.  Decreased coughing and sore throat.  Slower heart rate.  Lower blood pressure.  Clearer skin.  The ability to breathe more easily.  Fewer sick days. Quitting smoking is very challenging for most people. Do not get discouraged if you are not successful the first time. Some people need to make many attempts to quit before they achieve long-term success. Do your best to stick to your quit plan, and talk with your health care provider if you have any questions or concerns.   This information is not intended to replace advice given to you by your health care provider. Make sure you discuss any questions you have with your health care provider.   Document Released: 01/15/2001 Document Revised: 06/07/2014 Document Reviewed: 06/07/2014 Elsevier Interactive Patient Education [employer]

## 2015-03-08 NOTE — Progress Notes (Signed)
Subjective:    Patient ID: Tonya Le, female    DOB: 11-18-1990, 25 y.o.   MRN: [id number]  Chief Complaint  Patient presents with  . CPE    CPE needs refill of birth control    HPI:  Tonya Le is a 25 y.o. female who presents today for an annual wellness visit.   1) Health Maintenance -   Diet - Averages about 2-3 meals per day consisting of fruits, vegetables, chicken, occasional beef; Denies caffeine intake  Exercise - Walks with the dog   2) Preventative Exams / Immunizations:  Dental -- Due for exam  Vision -- Due for exam    Health Maintenance  Topic Date Due  . HIV Screening  05/02/2003  . INFLUENZA VACCINE  10/20/2015 (Originally 09/05/2014)  . PAP SMEAR  01/31/2017  . TETANUS/TDAP  01/10/2020     There is no immunization history on file for this patient.  No Known Allergies   Outpatient Prescriptions Prior to Visit  Medication Sig Dispense Refill  . drospirenone-ethinyl estradiol (YAZ,GIANVI,LORYNA) 3-0.02 MG tablet Take 1 tablet by mouth daily. 1 Package 11  . amoxicillin (AMOXIL) 500 MG capsule Take 1 capsule (500 mg total) by mouth 3 (three) times daily. 30 capsule 0   No facility-administered medications prior to visit.     Past Medical History  Diagnosis Date  . Depression   . Anxiety   . PTSD (post-traumatic stress disorder)   . Anorexia nervosa   . Genital warts   . UTI (urinary tract infection)   . Substance abuse     multiple     Past Surgical History  Procedure Laterality Date  . Appendectomy       Family History  Problem Relation Age of Onset  . Heart attack Mother   . Early death Mother   . Mental illness Mother   . Alcohol abuse Maternal Grandmother   . Stroke Maternal Grandmother   . Bipolar disorder Maternal Grandmother   . Alcohol abuse Maternal Grandfather   . Lung cancer Maternal Grandfather   . Alcohol abuse Paternal Grandmother   . Alcohol abuse Paternal Grandfather   . Hypertension Paternal  Grandfather      Social History   Social History  . Marital Status: Single    Spouse Name: N/A  . Number of Children: 0  . Years of Education: 16   Occupational History  . Server    Social History Main Topics  . Smoking status: Current Every Day Smoker -- 0.33 packs/day for 10 years  . Smokeless tobacco: Never Used  . Alcohol Use: 4.2 oz/week    0 Standard drinks or equivalent, 7 Cans of beer per week  . Drug Use: Yes    Special: Marijuana  . Sexual Activity: Yes    Birth Control/ Protection: Pill   Other Topics Concern  . Not on file   Social History Narrative   Fun: Sleep, play her dog   Denies religious beliefs effecting health care.   Originally from [place].    Denies abuse and feels safe at home    Review of Systems  Constitutional: Denies fever, chills, fatigue, or significant weight gain/loss. HENT: Head: Denies headache or neck pain Ears: Denies changes in hearing, ringing in ears, earache, drainage Nose: Denies discharge, stuffiness, itching, nosebleed, sinus pain Throat: Denies sore throat, hoarseness, dry mouth, sores, thrush Eyes: Denies loss/changes in vision, pain, redness, blurry/double vision, flashing lights Cardiovascular: Denies chest pain/discomfort, tightness, palpitations, shortness of breath with  activity, difficulty lying down, swelling, sudden awakening with shortness of breath Respiratory: Denies shortness of breath, cough, sputum production, wheezing Gastrointestinal: Denies dysphasia, heartburn, change in appetite, nausea, change in bowel habits, rectal bleeding, constipation, diarrhea, yellow skin or eyes Genitourinary: Denies frequency, urgency, burning/pain, blood in urine, incontinence, change in urinary strength. Musculoskeletal: Denies muscle/joint pain, stiffness, back pain, redness or swelling of joints, trauma Skin: Denies rashes, lumps, itching, dryness, color changes, or hair/nail changes Neurological: Denies dizziness, fainting,  seizures, weakness, numbness, tingling, tremor Psychiatric - Denies nervousness, stress, depression or memory loss Endocrine: Denies heat or cold intolerance, sweating, frequent urination, excessive thirst, changes in appetite Hematologic: Denies ease of bruising or bleeding     Objective:    BP 120/84 mmHg  Pulse 94  Temp(Src) 98.6 F (37 C) (Oral)  Resp 18  Ht 5' 3.5" (1.613 m)  Wt 140 lb 12.8 oz (63.866 kg)  BMI 24.55 kg/m2  SpO2 98% Nursing note and vital signs reviewed.  Physical Exam  Constitutional: She is oriented to person, place, and time. She appears well-developed and well-nourished.  HENT:  Head: Normocephalic.  Right Ear: Hearing, tympanic membrane, external ear and ear canal normal.  Left Ear: Hearing, tympanic membrane, external ear and ear canal normal.  Nose: Nose normal.  Mouth/Throat: Uvula is midline, oropharynx is clear and moist and mucous membranes are normal.  Eyes: Conjunctivae and EOM are normal. Pupils are equal, round, and reactive to light.  Neck: Neck supple. No JVD present. No tracheal deviation present. No thyromegaly present.  Cardiovascular: Normal rate, regular rhythm, normal heart sounds and intact distal pulses.   Pulmonary/Chest: Effort normal and breath sounds normal.  Abdominal: Soft. Bowel sounds are normal. She exhibits no distension and no mass. There is no tenderness. There is no rebound and no guarding.  Musculoskeletal: Normal range of motion. She exhibits no edema or tenderness.  Lymphadenopathy:    She has no cervical adenopathy.  Neurological: She is alert and oriented to person, place, and time. She has normal reflexes. No cranial nerve deficit. She exhibits normal muscle tone. Coordination normal.  Skin: Skin is warm and dry.  Psychiatric: She has a normal mood and affect. Her behavior is normal. Judgment and thought content normal.       Assessment & Plan:   Problem List Items Addressed This Visit      Other    Encounter for birth control pills maintenance   Relevant Medications   drospirenone-ethinyl estradiol (YAZ,GIANVI,LORYNA) 3-0.02 MG tablet   Routine general medical examination at a health care facility - Primary    1) Anticipatory Guidance: Discussed importance of wearing a seatbelt while driving and not texting while driving; changing batteries in smoke detector at least once annually; wearing suntan lotion when outside; eating a balanced and moderate diet; getting physical activity at least 30 minutes per day.  2) Immunizations / Screenings / Labs:  Declines influenza. All other immunizations are up-to-date per recommendations. Due for a dental and vision screen encouraged to be completed independently. All other screenings are up-to-date per recommendations. Obtain CBC, CMET, Lipid profile and TSH when fasting.   Overall well exam with risk factors for cardiovascular disease including tobacco use. Counseled for approximately 5 minutes regarding tobacco cessation and importance of reducing risk for future cardiovascular and pulmonary disease. Discussed potential use of patches, gums, and medication. Patient wishes to hold off at this time. Smoking cessation information provided and after visit summary. Recommended continue adequate nutrition intake with a balanced, married,  and moderate diet that is focused on nutrient dense foods. Continue physical activity. She is of good weight. Continue other healthy lifestyle behaviors and choices. Follow-up prevention exam in 1 year. Follow-up office visit pending blood work as necessary.       Relevant Orders   CBC   Comprehensive metabolic panel   Lipid panel   TSH    Other Visit Diagnoses    Tobacco use disorder, continuous        Encounter for smoking cessation counseling

## 2016-03-18 ENCOUNTER — Other Ambulatory Visit: Payer: Self-pay | Admitting: Family

## 2016-03-18 DIAGNOSIS — Z3041 Encounter for surveillance of contraceptive pills: Secondary | ICD-10-CM

## 2016-03-19 NOTE — Telephone Encounter (Signed)
Needs office follow up please.

## 2016-03-19 NOTE — Telephone Encounter (Signed)
Has not been seen since 03/08/15

## 2016-03-26 ENCOUNTER — Encounter: Payer: BLUE CROSS/BLUE SHIELD | Admitting: Family

## 2016-04-03 ENCOUNTER — Ambulatory Visit (INDEPENDENT_AMBULATORY_CARE_PROVIDER_SITE_OTHER): Payer: BLUE CROSS/BLUE SHIELD | Admitting: Family

## 2016-04-03 ENCOUNTER — Encounter: Payer: Self-pay | Admitting: Family

## 2016-04-03 VITALS — BP 112/80 | HR 84 | Temp 98.3°F | Resp 16 | Ht 63.5 in | Wt 146.0 lb

## 2016-04-03 DIAGNOSIS — Z01419 Encounter for gynecological examination (general) (routine) without abnormal findings: Secondary | ICD-10-CM

## 2016-04-03 DIAGNOSIS — Z0000 Encounter for general adult medical examination without abnormal findings: Secondary | ICD-10-CM | POA: Diagnosis not present

## 2016-04-03 DIAGNOSIS — Z3041 Encounter for surveillance of contraceptive pills: Secondary | ICD-10-CM

## 2016-04-03 MED ORDER — DROSPIRENONE-ETHINYL ESTRADIOL 3-0.02 MG PO TABS: 1.0000 | ORAL_TABLET | Freq: Every day | ORAL | 11 refills | Status: DC

## 2016-04-03 NOTE — Assessment & Plan Note (Signed)
Stable with current dosage of drospirenone-ethinyl estradiol with no adverse side effects. Encouraged tobacco cessation for decreased clotting risk in the future. Discussed importance of taking medication as prescribed for contraception and this will not protect against sexual transmitted diseases. Continue current dosage of drospirenone--ethinyl estradiol.

## 2016-04-03 NOTE — Progress Notes (Signed)
Subjective:    Patient ID: Tonya Le, female    DOB: Aug 17, 1990, 26 y.o.   MRN: [id number]  Chief Complaint  Patient presents with  . CPE          HPI:  Tonya Neighborsnna Grosshans is a 26 y.o. female who presents today for an annual wellness visit.   1) Health Maintenance -   Diet - Averages about 5 small meals throughout the day consisting regular diet; Caffeine intake of about 1-2 cups per day  Exercise - No structured exercises; walking dog on occasion.    2) Preventative Exams / Immunizations:  Dental -- Due for exam  Vision -- Due for exam    Health Maintenance  Topic Date Due  . HIV Screening  05/02/2003  . INFLUENZA VACCINE  05/04/2016 (Originally 09/05/2015)  . PAP SMEAR  01/31/2017  . TETANUS/TDAP  01/10/2020     There is no immunization history on file for this patient.   No Known Allergies   Outpatient Medications Prior to Visit  Medication Sig Dispense Refill  . drospirenone-ethinyl estradiol (YAZ,GIANVI,LORYNA) 3-0.02 MG tablet Take 1 tablet by mouth daily. 1 Package 11   No facility-administered medications prior to visit.      Past Medical History:  Diagnosis Date  . Anorexia nervosa   . Anxiety   . Depression   . Genital warts   . PTSD (post-traumatic stress disorder)   . Substance abuse    multiple  . UTI (urinary tract infection)      Past Surgical History:  Procedure Laterality Date  . APPENDECTOMY       Family History  Problem Relation Age of Onset  . Heart attack Mother   . Early death Mother   . Mental illness Mother   . Alcohol abuse Maternal Grandmother   . Stroke Maternal Grandmother   . Bipolar disorder Maternal Grandmother   . Alcohol abuse Maternal Grandfather   . Lung cancer Maternal Grandfather   . Alcohol abuse Paternal Grandmother   . Alcohol abuse Paternal Grandfather   . Hypertension Paternal Grandfather      Social History   Social History  . Marital status: Single    Spouse name: N/A  . Number  of children: 0  . Years of education: 7416   Occupational History  . Server    Social History Main Topics  . Smoking status: Current Every Day Smoker    Packs/day: 0.33    Years: 10.00  . Smokeless tobacco: Never Used  . Alcohol use 1.2 oz/week    2 Cans of beer per week  . Drug use: Yes    Frequency: 5.0 times per week    Types: Marijuana  . Sexual activity: Yes    Birth control/ protection: Pill   Other Topics Concern  . Not on file   Social History Narrative   Fun: Sleep, play her dog   Denies religious beliefs effecting health care.   Originally from [place].    Denies abuse and feels safe at home      Review of Systems  Constitutional: Denies fever, chills, fatigue, or significant weight gain/loss. HENT: Head: Denies headache or neck pain Ears: Denies changes in hearing, ringing in ears, earache, drainage Nose: Denies discharge, stuffiness, itching, nosebleed, sinus pain Throat: Denies sore throat, hoarseness, dry mouth, sores, thrush Eyes: Denies loss/changes in vision, pain, redness, blurry/double vision, flashing lights Cardiovascular: Denies chest pain/discomfort, tightness, palpitations, shortness of breath with activity, difficulty lying down, swelling, sudden awakening with  shortness of breath Respiratory: Denies shortness of breath, cough, sputum production, wheezing Gastrointestinal: Denies dysphasia, heartburn, change in appetite, nausea, change in bowel habits, rectal bleeding, constipation, diarrhea, yellow skin or eyes Genitourinary: Denies frequency, urgency, burning/pain, blood in urine, incontinence, change in urinary strength. Musculoskeletal: Denies muscle/joint pain, stiffness, back pain, redness or swelling of joints, trauma Skin: Denies rashes, lumps, itching, dryness, color changes, or hair/nail changes Neurological: Denies dizziness, fainting, seizures, weakness, numbness, tingling, tremor Psychiatric - Denies nervousness, stress, depression or  memory loss Endocrine: Denies heat or cold intolerance, sweating, frequent urination, excessive thirst, changes in appetite Hematologic: Denies ease of bruising or bleeding     Objective:     BP 112/80 (BP Location: Left Arm, Patient Position: Sitting, Cuff Size: Normal)   Pulse 84   Temp 98.3 F (36.8 C) (Oral)   Resp 16   Ht 5' 3.5" (1.613 m)   Wt 146 lb (66.2 kg)   SpO2 99%   BMI 25.46 kg/m  Nursing note and vital signs reviewed.  Physical Exam  Constitutional: She is oriented to person, place, and time. She appears well-developed and well-nourished.  HENT:  Head: Normocephalic.  Right Ear: Hearing, tympanic membrane, external ear and ear canal normal.  Left Ear: Hearing, tympanic membrane, external ear and ear canal normal.  Nose: Nose normal.  Mouth/Throat: Uvula is midline, oropharynx is clear and moist and mucous membranes are normal.  Eyes: Conjunctivae and EOM are normal. Pupils are equal, round, and reactive to light.  Neck: Neck supple. No JVD present. No tracheal deviation present. No thyromegaly present.  Cardiovascular: Normal rate, regular rhythm, normal heart sounds and intact distal pulses.   Pulmonary/Chest: Effort normal and breath sounds normal.  Abdominal: Soft. Bowel sounds are normal. She exhibits no distension and no mass. There is no tenderness. There is no rebound and no guarding.  Musculoskeletal: Normal range of motion. She exhibits no edema or tenderness.  Lymphadenopathy:    She has no cervical adenopathy.  Neurological: She is alert and oriented to person, place, and time. She has normal reflexes. No cranial nerve deficit. She exhibits normal muscle tone. Coordination normal.  Skin: Skin is warm and dry.  Psychiatric: She has a normal mood and affect. Her behavior is normal. Judgment and thought content normal.       Assessment & Plan:   Problem List Items Addressed This Visit      Other   Encounter for birth control pills maintenance     Stable with current dosage of drospirenone-ethinyl estradiol with no adverse side effects. Encouraged tobacco cessation for decreased clotting risk in the future. Discussed importance of taking medication as prescribed for contraception and this will not protect against sexual transmitted diseases. Continue current dosage of drospirenone--ethinyl estradiol.      Relevant Medications   drospirenone-ethinyl estradiol (YAZ,GIANVI,LORYNA) 3-0.02 MG tablet   Routine general medical examination at a health care facility - Primary    1) Anticipatory Guidance: Discussed importance of wearing a seatbelt while driving and not texting while driving; changing batteries in smoke detector at least once annually; wearing suntan lotion when outside; eating a balanced and moderate diet; getting physical activity at least 30 minutes per day.  2) Immunizations / Screenings / Labs:  Declines influenza. All other immunizations are up-to-date per recommendations. Due for cervical cancer screening with referral to gynecology placed. All other screenings are up-to-date per recommendations. Obtain CBC, CMET, and lipid profile.    Overall well exam with risk factors for cardiovascular  disease including tobacco use and sedentary lifestyle. Discussed importance of tobacco cessation for reduction of cardiovascular, respiratory, and malignant diseases in the future. She is in the precontemplation phase and not ready to quit smoking at this time. Encouraged to increase physical activity to 30 minutes of moderate level activity daily. Continue other healthy lifestyle behaviors and choices. Follow-up prevention exam in 1 year. Follow-up office visit for chronic conditions as needed.      Relevant Orders   CBC   Comprehensive metabolic panel   Lipid panel   VITAMIN D 25 Hydroxy (Vit-D Deficiency, Fractures)    Other Visit Diagnoses    Well woman exam       Relevant Orders   Ambulatory referral to Gynecology       I am  having Ms. Blakeney maintain her fluvoxaMINE, busPIRone, lamoTRIgine, traZODone, prazosin, and drospirenone-ethinyl estradiol.   Meds ordered this encounter  Medications  . fluvoxaMINE (LUVOX) 100 MG tablet    Sig: Take 100 mg by mouth 3 (three) times daily.  . busPIRone (BUSPAR) 30 MG tablet    Sig: Take 30 mg by mouth 3 (three) times daily.  Marland Kitchen lamoTRIgine (LAMICTAL) 100 MG tablet    Sig: Take 100 mg by mouth 2 (two) times daily.  . traZODone (DESYREL) 100 MG tablet    Sig: Take 100 mg by mouth at bedtime.  . prazosin (MINIPRESS) 2 MG capsule    Sig: Take 2 mg by mouth at bedtime.  . drospirenone-ethinyl estradiol (YAZ,GIANVI,LORYNA) 3-0.02 MG tablet    Sig: Take 1 tablet by mouth daily.    Dispense:  1 Package    Refill:  11    Order Specific Question:   Supervising Provider    Answer:   Hillard Danker A [[id number]]     Follow-up: Return if symptoms worsen or fail to improve.   Jeanine Luz, FNP

## 2016-04-03 NOTE — Assessment & Plan Note (Signed)
1) Anticipatory Guidance: Discussed importance of wearing a seatbelt while driving and not texting while driving; changing batteries in smoke detector at least once annually; wearing suntan lotion when outside; eating a balanced and moderate diet; getting physical activity at least 30 minutes per day.  2) Immunizations / Screenings / Labs:  Declines influenza. All other immunizations are up-to-date per recommendations. Due for cervical cancer screening with referral to gynecology placed. All other screenings are up-to-date per recommendations. Obtain CBC, CMET, and lipid profile.    Overall well exam with risk factors for cardiovascular disease including tobacco use and sedentary lifestyle. Discussed importance of tobacco cessation for reduction of cardiovascular, respiratory, and malignant diseases in the future. She is in the precontemplation phase and not ready to quit smoking at this time. Encouraged to increase physical activity to 30 minutes of moderate level activity daily. Continue other healthy lifestyle behaviors and choices. Follow-up prevention exam in 1 year. Follow-up office visit for chronic conditions as needed.

## 2016-04-03 NOTE — Patient Instructions (Addendum)
Thank you for choosing [facility].  SUMMARY AND INSTRUCTIONS:  For your ears check on Murine or Debrox ear cleaning kits.  Sounds like you are doing well - continue to work on your overall health and physical activity.  Medication:  Your prescription(s) have been submitted to your pharmacy or been printed and provided for you. Please take as directed and contact our office if you believe you are having problem(s) with the medication(s) or have any questions.  Labs:  Please stop by the lab on the lower level of the building for your blood work. Your results will be released to [facility] (or called to you) after review, usually within 72 hours after test completion. If any changes need to be made, you will be notified at that same time.  1.) The lab is open from 7:30am to 5:30 pm Monday-Friday 2.) No appointment is necessary 3.) Fasting (if needed) is 6-8 hours after food and drink; black coffee and water are okay   Follow up:  If your symptoms worsen or fail to improve, please contact our office for further instruction, or in case of emergency go directly to the emergency room at the closest medical facility.    Health Maintenance, Female Adopting a healthy lifestyle and getting preventive care can go a long way to promote health and wellness. Talk with your health care provider about what schedule of regular examinations is right for you. This is a good chance for you to check in with your provider about disease prevention and staying healthy. In between checkups, there are plenty of things you can do on your own. Experts have done a lot of research about which lifestyle changes and preventive measures are most likely to keep you healthy. Ask your health care provider for more information. Weight and diet Eat a healthy diet  Be sure to include plenty of vegetables, fruits, low-fat dairy products, and lean protein.  Do not eat a lot of foods high in solid fats, added sugars, or  salt.  Get regular exercise. This is one of the most important things you can do for your health.  Most adults should exercise for at least 150 minutes each week. The exercise should increase your heart rate and make you sweat (moderate-intensity exercise).  Most adults should also do strengthening exercises at least twice a week. This is in addition to the moderate-intensity exercise. Maintain a healthy weight  Body mass index (BMI) is a measurement that can be used to identify possible weight problems. It estimates body fat based on height and weight. Your health care provider can help determine your BMI and help you achieve or maintain a healthy weight.  For females 28 years of age and older:  A BMI below 18.5 is considered underweight.  A BMI of 18.5 to 24.9 is normal.  A BMI of 25 to 29.9 is considered overweight.  A BMI of 30 and above is considered obese. Watch levels of cholesterol and blood lipids  You should start having your blood tested for lipids and cholesterol at 28 years of age tested for lipids and cholesterol at 28 years of age, then have this test every 5 years.  You may need to have your cholesterol levels checked more often if:  Your lipid or cholesterol levels are high.  You are older than 28 years of age.  You are at high risk for heart disease. Cancer screening Lung Cancer  Lung cancer screening is recommended for adults 28-13 years old who are at high risk for lung cancer because of a history  of smoking.  A yearly low-dose CT scan of the lungs is recommended for people who:  Currently smoke.  Have quit within the past 15 years.  Have at least a 30-pack-year history of smoking. A pack year is smoking an average of one pack of cigarettes a day for 1 year.  Yearly screening should continue until it has been 15 years since you quit.  Yearly screening should stop if you develop a health problem that would prevent you from having lung cancer treatment. Breast Cancer  Practice breast self-awareness.  This means understanding how your breasts normally appear and feel.  It also means doing regular breast self-exams. Let your health care provider know about any changes, no matter how small.  If you are in your 20s or 30s, you should have a clinical breast exam (CBE) by a health care provider every 28-3 years by a health care provider every 1-3 years as part of a regular health exam.  If you are 55 or older, have a CBE every year. Also consider having a breast X-ray (mammogram) every year.  If you have a family history of breast cancer, talk to your health care provider about genetic screening.  If you are at high risk for breast cancer, talk to your health care provider about having an MRI and a mammogram every year.  Breast cancer gene (BRCA) assessment is recommended for women who have family members with BRCA-related cancers. BRCA-related cancers include:  Breast.  Ovarian.  Tubal.  Peritoneal cancers.  Results of the assessment will determine the need for genetic counseling and BRCA1 and BRCA2 testing. Cervical Cancer  Your health care provider may recommend that you be screened regularly for cancer of the pelvic organs (ovaries, uterus, and vagina). This screening involves a pelvic examination, including checking for microscopic changes to the surface of your cervix (Pap test). You may be encouraged to have this screening done every 3 years, beginning at age 28.  For women ages 28-65, health care providers may recommend pelvic exams and Pap testing every 3 years, or they may recommend the Pap and pelvic exam, combined with testing for human papilloma virus (HPV), every 5 years. Some types of HPV increase your risk of cervical cancer. Testing for HPV may also be done on women of any age with unclear Pap test results.  Other health care providers may not recommend any screening for nonpregnant women who are considered low risk for pelvic cancer and who do not have symptoms. Ask your health care provider if a screening pelvic  exam is right for you.  If you have had past treatment for cervical cancer or a condition that could lead to cancer, you need Pap tests and screening for cancer for at least 20 years after your treatment. If Pap tests have been discontinued, your risk factors (such as having a new sexual partner) need to be reassessed to determine if screening should resume. Some women have medical problems that increase the chance of getting cervical cancer. In these cases, your health care provider may recommend more frequent screening and Pap tests. Colorectal Cancer  This type of cancer can be detected and often prevented.  Routine colorectal cancer screening usually begins at 28 years of age and continues through 28 years of age.  Your health care provider may recommend screening at an earlier age if you have risk factors for colon cancer.  Your health care provider may also recommend using home test kits to check for hidden blood in the stool.  A small camera at the  end of a tube can be used to examine your colon directly (sigmoidoscopy or colonoscopy). This is done to check for the earliest forms of colorectal cancer.  Routine screening usually begins at age 63.  Direct examination of the colon should be repeated every 5-10 years through 28 years of age. However, you may need to be screened more often if early forms of precancerous polyps or small growths are found. Skin Cancer  Check your skin from head to toe regularly.  Tell your health care provider about any new moles or changes in moles, especially if there is a change in a mole's shape or color.  Also tell your health care provider if you have a mole that is larger than the size of a pencil eraser.  Always use sunscreen. Apply sunscreen liberally and repeatedly throughout the day.  Protect yourself by wearing long sleeves, pants, a wide-brimmed hat, and sunglasses whenever you are outside. Heart disease, diabetes, and high blood  pressure  High blood pressure causes heart disease and increases the risk of stroke. High blood pressure is more likely to develop in:  People who have blood pressure in the high end of the normal range (130-139/85-89 mm Hg).  People who are overweight or obese.  People who are African American.  If you are 66-88 years of age, have your blood pressure checked every 3-5 years. If you are 77 years of age or older, have your blood pressure checked every year. You should have your blood pressure measured twice-once when you are at a hospital or clinic, and once when you are not at a hospital or clinic. Record the average of the two measurements. To check your blood pressure when you are not at a hospital or clinic, you can use:  An automated blood pressure machine at a pharmacy.  A home blood pressure monitor.  If you are between 68 years and 48 years old, ask your health care provider if you should take aspirin to prevent strokes.  Have regular diabetes screenings. This involves taking a blood sample to check your fasting blood sugar level.  If you are at a normal weight and have a low risk for diabetes, have this test once every three years after 28 years of age.  If you are overweight and have a high risk for diabetes, consider being tested at a younger age or more often. Preventing infection Hepatitis B  If you have a higher risk for hepatitis B, you should be screened for this virus. You are considered at high risk for hepatitis B if:  You were born in a country where hepatitis B is common. Ask your health care provider which countries are considered high risk.  Your parents were born in a high-risk country, and you have not been immunized against hepatitis B (hepatitis B vaccine).  You have HIV or AIDS.  You use needles to inject street drugs.  You live with someone who has hepatitis B.  You have had sex with someone who has hepatitis B.  You get hemodialysis  treatment.  You take certain medicines for conditions, including cancer, organ transplantation, and autoimmune conditions. Hepatitis C  Blood testing is recommended for:  Everyone born from [birth year] through 1967.  Anyone with known risk factors for hepatitis C. Sexually transmitted infections (STIs)  You should be screened for sexually transmitted infections (STIs) including gonorrhea and chlamydia if:  You are sexually active and are younger than 28 years of age.  You are older than 28  years of age and your health care provider tells you that you are at risk for this type of infection.  Your sexual activity has changed since you were last screened and you are at an increased risk for chlamydia or gonorrhea. Ask your health care provider if you are at risk.  If you do not have HIV, but are at risk, it may be recommended that you take a prescription medicine daily to prevent HIV infection. This is called pre-exposure prophylaxis (PrEP). You are considered at risk if:  You are sexually active and do not regularly use condoms or know the HIV status of your partner(s).  You take drugs by injection.  You are sexually active with a partner who has HIV. Talk with your health care provider about whether you are at high risk of being infected with HIV. If you choose to begin PrEP, you should first be tested for HIV. You should then be tested every 3 months for as long as you are taking PrEP. Pregnancy  If you are premenopausal and you may become pregnant, ask your health care provider about preconception counseling.  If you may become pregnant, take 400 to 800 micrograms (mcg) of folic acid every day.  If you want to prevent pregnancy, talk to your health care provider about birth control (contraception). Osteoporosis and menopause  Osteoporosis is a disease in which the bones lose minerals and strength with aging. This can result in serious bone fractures. Your risk for osteoporosis can be  identified using a bone density scan.  If you are 39 years of age or older, or if you are at risk for osteoporosis and fractures, ask your health care provider if you should be screened.  Ask your health care provider whether you should take a calcium or vitamin D supplement to lower your risk for osteoporosis.  Menopause may have certain physical symptoms and risks.  Hormone replacement therapy may reduce some of these symptoms and risks. Talk to your health care provider about whether hormone replacement therapy is right for you. Follow these instructions at home:  Schedule regular health, dental, and eye exams.  Stay current with your immunizations.  Do not use any tobacco products including cigarettes, chewing tobacco, or electronic cigarettes.  If you are pregnant, do not drink alcohol.  If you are breastfeeding, limit how much and how often you drink alcohol.  Limit alcohol intake to no more than 1 drink per day for nonpregnant women. One drink equals 12 ounces of beer, 5 ounces of wine, or 1 ounces of hard liquor.  Do not use street drugs.  Do not share needles.  Ask your health care provider for help if you need support or information about quitting drugs.  Tell your health care provider if you often feel depressed.  Tell your health care provider if you have ever been abused or do not feel safe at home. This information is not intended to replace advice given to you by your health care provider. Make sure you discuss any questions you have with your health care provider. Document Released: 08/06/2010 Document Revised: 06/29/2015 Document Reviewed: 10/25/2014 Elsevier Interactive Patient Education  2017 [employer].

## 2016-06-05 ENCOUNTER — Encounter: Payer: Self-pay | Admitting: Family

## 2016-11-07 ENCOUNTER — Ambulatory Visit: Payer: BLUE CROSS/BLUE SHIELD | Admitting: Family Medicine

## 2017-04-17 ENCOUNTER — Telehealth: Payer: Self-pay | Admitting: Family

## 2017-04-17 NOTE — Telephone Encounter (Signed)
Copied from CRM [phone]. Topic: Quick Communication - Rx Refill/Question >> Apr 17, 2017 12:33 PM Alexander BergeronBarksdale, Harvey B wrote: Medication: Birth Control Pt of [facility], pt was told that she needs to set up an appt for transfer of care but pt would like to see if she can get a new Rx w/ this before setting up that appt, contact pt to advise

## 2017-04-17 NOTE — Telephone Encounter (Signed)
Former Dispensing opticianCalone pt asking if birth control medication Drospirenone-ethinly estradiol(YAZ) could be called in before setting up a transfer of care appt.  LOV: 04/03/16 with Allene DillonGregory [facility],NP

## 2017-04-18 NOTE — Telephone Encounter (Signed)
Decline to refill due to Tammy SoursGreg last OV notes he referred pt to see Gynecologist.../lmb

## 2017-04-22 NOTE — Telephone Encounter (Signed)
Transferred to [facility]

## 2017-04-23 ENCOUNTER — Other Ambulatory Visit (HOSPITAL_COMMUNITY)
Admission: RE | Admit: 2017-04-23 | Discharge: 2017-04-23 | Disposition: A | Discharge status: Home / Self Care | Payer: BLUE CROSS/BLUE SHIELD | Source: Ambulatory Visit | Attending: Family Medicine | Admitting: Family Medicine

## 2017-04-23 ENCOUNTER — Encounter: Payer: Self-pay | Admitting: Family Medicine

## 2017-04-23 ENCOUNTER — Ambulatory Visit (INDEPENDENT_AMBULATORY_CARE_PROVIDER_SITE_OTHER): Payer: BLUE CROSS/BLUE SHIELD | Admitting: Family Medicine

## 2017-04-23 VITALS — BP 128/78 | HR 80 | Temp 98.1°F | Ht 63.5 in | Wt 140.0 lb

## 2017-04-23 DIAGNOSIS — F17200 Nicotine dependence, unspecified, uncomplicated: Secondary | ICD-10-CM | POA: Insufficient documentation

## 2017-04-23 DIAGNOSIS — F4310 Post-traumatic stress disorder, unspecified: Secondary | ICD-10-CM | POA: Insufficient documentation

## 2017-04-23 DIAGNOSIS — Z0001 Encounter for general adult medical examination with abnormal findings: Secondary | ICD-10-CM | POA: Insufficient documentation

## 2017-04-23 DIAGNOSIS — Z113 Encounter for screening for infections with a predominantly sexual mode of transmission: Secondary | ICD-10-CM | POA: Diagnosis present

## 2017-04-23 DIAGNOSIS — Z114 Encounter for screening for human immunodeficiency virus [HIV]: Secondary | ICD-10-CM | POA: Diagnosis not present

## 2017-04-23 DIAGNOSIS — F1911 Other psychoactive substance abuse, in remission: Secondary | ICD-10-CM | POA: Diagnosis not present

## 2017-04-23 DIAGNOSIS — Z3041 Encounter for surveillance of contraceptive pills: Secondary | ICD-10-CM

## 2017-04-23 DIAGNOSIS — F325 Major depressive disorder, single episode, in full remission: Secondary | ICD-10-CM | POA: Diagnosis not present

## 2017-04-23 DIAGNOSIS — Z13220 Encounter for screening for lipoid disorders: Secondary | ICD-10-CM

## 2017-04-23 LAB — COMPREHENSIVE METABOLIC PANEL
ALT: 8 U/L (ref 0–35)
AST: 10 U/L (ref 0–37)
Albumin: 4.3 g/dL (ref 3.5–5.2)
Alkaline Phosphatase: 52 U/L (ref 39–117)
BUN: 9 mg/dL (ref 6–23)
CALCIUM: 9.7 mg/dL (ref 8.4–10.5)
CHLORIDE: 107 meq/L (ref 96–112)
CO2: 23 meq/L (ref 19–32)
CREATININE: 0.76 mg/dL (ref 0.40–1.20)
GFR: 95.64 mL/min (ref >60.00)
Glucose, Bld: 88 mg/dL (ref 70–99)
POTASSIUM: 3.9 meq/L (ref 3.5–5.1)
SODIUM: 145 meq/L (ref 135–145)
Total Bilirubin: 0.1 mg/dL — ABNORMAL LOW (ref 0.2–1.2)
Total Protein: 6.5 g/dL (ref 6.0–8.3)

## 2017-04-23 LAB — CBC
HEMATOCRIT: 38.7 % (ref 36.0–46.0)
Hemoglobin: 13 g/dL (ref 12.0–15.0)
MCHC: 33.7 g/dL (ref 30.0–36.0)
MCV: 89.3 fl (ref 78.0–100.0)
Platelets: 186 10*3/uL (ref 150.0–400.0)
RBC: 4.33 Mil/uL (ref 3.87–5.11)
RDW: 12.6 % (ref 11.5–15.5)
WBC: 6.5 10*3/uL (ref 4.0–10.5)

## 2017-04-23 LAB — LIPID PANEL
CHOLESTEROL: 192 mg/dL (ref 0–200)
HDL: 54.1 mg/dL (ref >39.00)
LDL CALC: 116 mg/dL — AB (ref 0–99)
NonHDL: 137.61
Total CHOL/HDL Ratio: 4
Triglycerides: 110 mg/dL (ref 0.0–149.0)
VLDL: 22 mg/dL (ref 0.0–40.0)

## 2017-04-23 LAB — TSH: TSH: 0.46 u[IU]/mL (ref 0.35–4.50)

## 2017-04-23 MED ORDER — DROSPIRENONE-ETHINYL ESTRADIOL 3-0.02 MG PO TABS: 1.0000 | ORAL_TABLET | Freq: Every day | ORAL | 11 refills | Status: DC

## 2017-04-23 NOTE — Assessment & Plan Note (Signed)
Stable. Continue current meds. Defer further management to psychiatry.

## 2017-04-23 NOTE — Progress Notes (Signed)
Subjective:  Tonya Le is a 27 y.o. female who presents today for her annual comprehensive physical exam and to transfer care to this office.   HPI:  She has no acute complaints today.   She has a few chronic conditions that are summarized below:  Depression / Substance Abuse / PTSD: Several year history. Stable on buspar 30mg  tid, fluvoxamine 100mg  tid, gabapentin 100mg  nightly, lamictal 100mg  bid, naltrexone 50mg  daily, prazosin 2mg  at night, and trazodone 100mg  at night. Patient has been sober for the past 70 days.  Nicotine Dependence: Smokes a third of a pack per day. Several year history.    Lifestyle Diet: No specific diets.  Exercise: Yoga every morning.   Depression screen PHQ 2/9 04/23/2017  Decreased Interest 0  Down, Depressed, Hopeless 1  PHQ - 2 Score 1  Altered sleeping 1  Tired, decreased energy 0  Change in appetite 0  Feeling bad or failure about yourself  1  Trouble concentrating 0  Moving slowly or fidgety/restless 0  Suicidal thoughts 0  PHQ-9 Score 3  Difficult doing work/chores Somewhat difficult    Health Maintenance Due  Topic Date Due  . HIV Screening  05/02/2003  . INFLUENZA VACCINE  09/04/2016  . PAP SMEAR  01/31/2017     ROS: Per HPI, otherwise a complete review of systems was negative.   PMH:  The following were reviewed and entered/updated in epic: Past Medical History:  Diagnosis Date  . Anorexia nervosa   . Anxiety   . Depression   . Genital warts   . PTSD (post-traumatic stress disorder)   . Substance abuse (HCC)    multiple  . UTI (urinary tract infection)    Patient Active Problem List   Diagnosis Date Noted  . Substance abuse in remission (HCC) 04/23/2017  . Nicotine dependence with current use 04/23/2017  . PTSD (post-traumatic stress disorder) 04/23/2017  . Encounter for birth control pills maintenance 07/11/2014  . 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 Eating disorder 07/11/2014   Past  Surgical History:  Procedure Laterality Date  . APPENDECTOMY     Family History  Problem Relation Age of Onset  . Heart attack Mother   . Early death Mother   . Mental illness Mother   . Alcohol abuse Maternal Grandmother   . Stroke Maternal Grandmother   . Bipolar disorder Maternal Grandmother   . Alcohol abuse Maternal Grandfather   . Lung cancer Maternal Grandfather   . Alcohol abuse Paternal Grandmother   . Alcohol abuse Paternal Grandfather   . Hypertension Paternal Grandfather    Medications- reviewed and updated Current Outpatient Medications  Medication Sig Dispense Refill  . busPIRone (BUSPAR) 30 MG tablet Take 30 mg by mouth 3 (three) times daily.    . drospirenone-ethinyl estradiol (YAZ,GIANVI,LORYNA) 3-0.02 MG tablet Take 1 tablet by mouth daily. 1 Package 11  . fluvoxaMINE (LUVOX) 100 MG tablet Take 100 mg by mouth 3 (three) times daily.    Marland Kitchen gabapentin (NEURONTIN) 100 MG capsule TAKE 1-3 TABLETS BY MOUTH AT BEDTIME AS NEEDED FOR INSOMNIA  3  . lamoTRIgine (LAMICTAL) 100 MG tablet Take 100 mg by mouth 2 (two) times daily.    . naltrexone (DEPADE) 50 MG tablet Take 50 mg by mouth daily.  0  . traZODone (DESYREL) 100 MG tablet Take 100 mg by mouth at bedtime.    . prazosin (MINIPRESS) 2 MG capsule Take 2 mg by mouth at bedtime.     No current  facility-administered medications for this visit.    Allergies-reviewed and updated No Known Allergies  Social History   Socioeconomic History  . Marital status: Single    Spouse name: None  . Number of children: 0  . Years of education: 9916  . Highest education level: None  Social Needs  . Financial resource strain: None  . Food insecurity - worry: None  . Food insecurity - inability: None  . Transportation needs - medical: None  . Transportation needs - non-medical: None  Occupational History  . Occupation: Server  Tobacco Use  . Smoking status: Current Every Day Smoker    Packs/day: 0.33    Years: 10.00    Pack  years: 3.30  . Smokeless tobacco: Never Used  Substance and Sexual Activity  . Alcohol use: Yes    Alcohol/week: 1.2 oz    Types: 2 Cans of beer per week  . Drug use: Yes    Frequency: 5.0 times per week    Types: Marijuana  . Sexual activity: Yes    Birth control/protection: Pill  Other Topics Concern  . None  Social History Narrative   Fun: Sleep, play her dog   Denies religious beliefs effecting health care.   Originally from [place].    Denies abuse and feels safe at home    Objective:  Physical Exam: BP 128/78 (BP Location: Right Arm)   Pulse 80   Temp 98.1 F (36.7 C)   Ht 5' 3.5" (1.613 m)   Wt 140 lb (63.5 kg)   LMP 04/23/2017   SpO2 98%   BMI 24.41 kg/m   Body mass index is 24.41 kg/m. Wt Readings from Last 3 Encounters:  04/23/17 140 lb (63.5 kg)  04/03/16 146 lb (66.2 kg)  03/08/15 140 lb 12.8 oz (63.9 kg)   Gen: NAD, resting comfortably HEENT: TMs normal bilaterally. OP clear. No thyromegaly noted.  CV: RRR with no murmurs appreciated Pulm: NWOB, CTAB with no crackles, wheezes, or rhonchi GI: Normal bowel sounds present. Soft, Nontender, Nondistended. GU: Normal external and internal female genitalia.  MSK: no edema, cyanosis, or clubbing noted Skin: warm, dry Neuro: CN2-12 grossly intact. Strength 5/5 in upper and lower extremities. Reflexes symmetric and intact bilaterally.  Psych: Normal affect and thought content  Assessment/Plan:  Nicotine dependence with current use Patient was asked about her tobacco use today and was strongly advised to quit. Patient is currently contemplative. We reviewed treatment options to assist her quit smoking including NRT, Chantix, and Bupropion. Patient not interested in pharmacotherapy at this time. Given her several other psychiatric medications would not be a great candidate for wellbutrin or chantix. Follow up at next office visit.   Total time spent counseling approximately 4 minutes.   Substance abuse in  remission (HCC) Stable. Encouraged continued cessation.   Major depression in remission (HCC) Stable. Continue current meds. Defer further management to psychiatry.   PTSD (post-traumatic stress disorder) Stable. Continue current medications. Defer further management to psychiatry.   Encounter for birth control pills maintenance OCP refilled today.    Preventative Healthcare: Check HIV antibody, lipid panel, and Pap test.  Patient Counseling:  -Nutrition: Stressed importance of moderation in sodium/caffeine intake, saturated fat and cholesterol, caloric balance, sufficient intake of fresh fruits, vegetables, and fiber.  -Stressed the importance of regular exercise.   -Substance Abuse: Discussed cessation/primary prevention of tobacco, alcohol, or other drug use; driving or other dangerous activities under the influence; availability of treatment for abuse.   -Injury prevention: Discussed  safety belts, safety helmets, smoke detector, smoking near bedding or upholstery.   -Sexuality: Discussed sexually transmitted diseases, partner selection, use of condoms, avoidance of unintended pregnancy and contraceptive alternatives.   -Dental health: Discussed importance of regular tooth brushing, flossing, and dental visits.  -Health maintenance and immunizations reviewed. Please refer to Health maintenance section.  Return to care in 1 year for next preventative visit.   Katina Degree. Jimmey Ralph, MD 04/23/2017 11:32 AM

## 2017-04-23 NOTE — Patient Instructions (Signed)
Preventive Care 18-39 Years, Female Preventive care refers to lifestyle choices and visits with your health care provider that can promote health and wellness. What does preventive care include?  A yearly physical exam. This is also called an annual well check.  Dental exams once or twice a year.  Routine eye exams. Ask your health care provider how often you should have your eyes checked.  Personal lifestyle choices, including: ? Daily care of your teeth and gums. ? Regular physical activity. ? Eating a healthy diet. ? Avoiding tobacco and drug use. ? Limiting alcohol use. ? Practicing safe sex. ? Taking vitamin and mineral supplements as recommended by your health care provider. What happens during an annual well check? The services and screenings done by your health care provider during your annual well check will depend on your age, overall health, lifestyle risk factors, and family history of disease. Counseling Your health care provider may ask you questions about your:  Alcohol use.  Tobacco use.  Drug use.  Emotional well-being.  Home and relationship well-being.  Sexual activity.  Eating habits.  Work and work Statistician.  Method of birth control.  Menstrual cycle.  Pregnancy history.  Screening You may have the following tests or measurements:  Height, weight, and BMI.  Diabetes screening. This is done by checking your blood sugar (glucose) after you have not eaten for a while (fasting).  Blood pressure.  Lipid and cholesterol levels. These may be checked every 5 years starting at age 66.  Skin check.  Hepatitis C blood test.  Hepatitis B blood test.  Sexually transmitted disease (STD) testing.  BRCA-related cancer screening. This may be done if you have a family history of breast, ovarian, tubal, or peritoneal cancers.  Pelvic exam and Pap test. This may be done every 3 years starting at age 40. Starting at age 59, this may be done every 5  years if you have a Pap test in combination with an HPV test.  Discuss your test results, treatment options, and if necessary, the need for more tests with your health care provider. Vaccines Your health care provider may recommend certain vaccines, such as:  Influenza vaccine. This is recommended every year.  Tetanus, diphtheria, and acellular pertussis (Tdap, Td) vaccine. You may need a Td booster every 10 years.  Varicella vaccine. You may need this if you have not been vaccinated.  HPV vaccine. If you are 69 or younger, you may need three doses over 6 months.  Measles, mumps, and rubella (MMR) vaccine. You may need at least one dose of MMR. You may also need a second dose.  Pneumococcal 13-valent conjugate (PCV13) vaccine. You may need this if you have certain conditions and were not previously vaccinated.  Pneumococcal polysaccharide (PPSV23) vaccine. You may need one or two doses if you smoke cigarettes or if you have certain conditions.  Meningococcal vaccine. One dose is recommended if you are age 27-21 years and a first-year college student living in a residence hall, or if you have one of several medical conditions. You may also need additional booster doses.  Hepatitis A vaccine. You may need this if you have certain conditions or if you travel or work in places where you may be exposed to hepatitis A.  Hepatitis B vaccine. You may need this if you have certain conditions or if you travel or work in places where you may be exposed to hepatitis B.  Haemophilus influenzae type b (Hib) vaccine. You may need this if  you have certain risk factors.  Talk to your health care provider about which screenings and vaccines you need and how often you need them. This information is not intended to replace advice given to you by your health care provider. Make sure you discuss any questions you have with your health care provider. Document Released: 03/19/2001 Document Revised: 10/11/2015  Document Reviewed: 11/22/2014 Elsevier Interactive Patient Education  [employer].

## 2017-04-23 NOTE — Assessment & Plan Note (Signed)
Patient was asked about her tobacco use today and was strongly advised to quit. Patient is currently contemplative. We reviewed treatment options to assist her quit smoking including NRT, Chantix, and Bupropion. Patient not interested in pharmacotherapy at this time. Given her several other psychiatric medications would not be a great candidate for wellbutrin or chantix. Follow up at next office visit.   Total time spent counseling approximately 4 minutes.

## 2017-04-23 NOTE — Assessment & Plan Note (Signed)
OCP refilled today.

## 2017-04-23 NOTE — Assessment & Plan Note (Signed)
Stable. Encouraged continued cessation.

## 2017-04-23 NOTE — Assessment & Plan Note (Signed)
Stable.  Continue current medications.  Defer further management to psychiatry. 

## 2017-04-24 LAB — CYTOLOGY - PAP
Adequacy: ABSENT
CHLAMYDIA, DNA PROBE: NEGATIVE
Diagnosis: NEGATIVE
NEISSERIA GONORRHEA: NEGATIVE
Trichomonas: NEGATIVE

## 2017-04-24 LAB — HIV ANTIBODY (ROUTINE TESTING W REFLEX): HIV: NONREACTIVE

## 2017-04-28 NOTE — Progress Notes (Signed)
Spoke with patient and gave her lab results.

## 2018-11-15 ENCOUNTER — Emergency Department (HOSPITAL_COMMUNITY)
Admission: EM | Admit: 2018-11-15 | Discharge: 2018-11-16 | Disposition: A | Discharge status: Home / Self Care | Payer: BLUE CROSS/BLUE SHIELD | Attending: Emergency Medicine | Admitting: Emergency Medicine

## 2018-11-15 ENCOUNTER — Other Ambulatory Visit: Payer: Self-pay

## 2018-11-15 DIAGNOSIS — F17210 Nicotine dependence, cigarettes, uncomplicated: Secondary | ICD-10-CM | POA: Diagnosis not present

## 2018-11-15 DIAGNOSIS — F325 Major depressive disorder, single episode, in full remission: Secondary | ICD-10-CM | POA: Diagnosis present

## 2018-11-15 DIAGNOSIS — F10920 Alcohol use, unspecified with intoxication, uncomplicated: Secondary | ICD-10-CM | POA: Diagnosis not present

## 2018-11-15 DIAGNOSIS — F1094 Alcohol use, unspecified with alcohol-induced mood disorder: Secondary | ICD-10-CM

## 2018-11-15 DIAGNOSIS — R45851 Suicidal ideations: Secondary | ICD-10-CM | POA: Diagnosis not present

## 2018-11-15 DIAGNOSIS — F4310 Post-traumatic stress disorder, unspecified: Secondary | ICD-10-CM | POA: Diagnosis present

## 2018-11-15 DIAGNOSIS — F314 Bipolar disorder, current episode depressed, severe, without psychotic features: Secondary | ICD-10-CM | POA: Diagnosis not present

## 2018-11-15 DIAGNOSIS — F1911 Other psychoactive substance abuse, in remission: Secondary | ICD-10-CM | POA: Diagnosis present

## 2018-11-15 LAB — COMPREHENSIVE METABOLIC PANEL
ALT: 14 U/L (ref 0–44)
AST: 19 U/L (ref 15–41)
Albumin: 4.6 g/dL (ref 3.5–5.0)
Alkaline Phosphatase: 67 U/L (ref 38–126)
Anion gap: 15 (ref 5–15)
BUN: 8 mg/dL (ref 6–20)
CO2: 19 mmol/L — ABNORMAL LOW (ref 22–32)
Calcium: 9.6 mg/dL (ref 8.9–10.3)
Chloride: 108 mmol/L (ref 98–111)
Creatinine, Ser: 0.81 mg/dL (ref 0.44–1.00)
GFR calc Af Amer: >60 mL/min (ref >60)
GFR calc non Af Amer: >60 mL/min (ref >60)
Glucose, Bld: 93 mg/dL (ref 70–99)
Potassium: 3.1 mmol/L — ABNORMAL LOW (ref 3.5–5.1)
Sodium: 142 mmol/L (ref 135–145)
Total Bilirubin: 0.3 mg/dL (ref 0.3–1.2)
Total Protein: 7.3 g/dL (ref 6.5–8.1)

## 2018-11-15 LAB — CBC WITH DIFFERENTIAL/PLATELET
Abs Immature Granulocytes: 0.12 10*3/uL — ABNORMAL HIGH (ref 0.00–0.07)
Basophils Absolute: 0.1 10*3/uL (ref 0.0–0.1)
Basophils Relative: 1 %
Eosinophils Absolute: 0.4 10*3/uL (ref 0.0–0.5)
Eosinophils Relative: 3 %
HCT: 39.4 % (ref 36.0–46.0)
Hemoglobin: 13 g/dL (ref 12.0–15.0)
Immature Granulocytes: 1 %
Lymphocytes Relative: 25 %
Lymphs Abs: 2.9 10*3/uL (ref 0.7–4.0)
MCH: 31.2 pg (ref 26.0–34.0)
MCHC: 33 g/dL (ref 30.0–36.0)
MCV: 94.5 fL (ref 80.0–100.0)
Monocytes Absolute: 0.9 10*3/uL (ref 0.1–1.0)
Monocytes Relative: 8 %
Neutro Abs: 7.5 10*3/uL (ref 1.7–7.7)
Neutrophils Relative %: 62 %
Platelets: 181 10*3/uL (ref 150–400)
RBC: 4.17 MIL/uL (ref 3.87–5.11)
RDW: 12.3 % (ref 11.5–15.5)
WBC: 11.9 10*3/uL — ABNORMAL HIGH (ref 4.0–10.5)
nRBC: 0 % (ref 0.0–0.2)

## 2018-11-15 LAB — I-STAT BETA HCG BLOOD, ED (MC, WL, AP ONLY): I-stat hCG, quantitative: <5 m[IU]/mL (ref <5)

## 2018-11-15 LAB — CK: Total CK: 93 U/L (ref 38–234)

## 2018-11-15 LAB — RAPID HIV SCREEN (HIV 1/2 AB+AG)
HIV 1/2 Antibodies: NONREACTIVE
HIV-1 P24 Antigen - HIV24: NONREACTIVE

## 2018-11-15 LAB — ETHANOL: Alcohol, Ethyl (B): 269 mg/dL — ABNORMAL HIGH (ref <10)

## 2018-11-15 LAB — LITHIUM LEVEL: Lithium Lvl: 0.56 mmol/L — ABNORMAL LOW (ref 0.60–1.20)

## 2018-11-15 LAB — SALICYLATE LEVEL: Salicylate Lvl: <7 mg/dL (ref 2.8–30.0)

## 2018-11-15 LAB — ACETAMINOPHEN LEVEL: Acetaminophen (Tylenol), Serum: <10 ug/mL — ABNORMAL LOW (ref 10–30)

## 2018-11-15 MED ORDER — STERILE WATER FOR INJECTION IJ SOLN
INTRAMUSCULAR | Status: AC
  Administered 2018-11-15: 1.2 mL
  Filled 2018-11-15: qty 10

## 2018-11-15 MED ORDER — ZIPRASIDONE MESYLATE 20 MG IM SOLR
20.0000 mg | Freq: Once | INTRAMUSCULAR | Status: DC
Start: 2018-11-15 — End: 2018-11-15
  Filled 2018-11-15: qty 20

## 2018-11-15 MED ORDER — DIPHENHYDRAMINE HCL 50 MG/ML IJ SOLN
50.0000 mg | Freq: Once | INTRAMUSCULAR | Status: DC
  Filled 2018-11-15: qty 1

## 2018-11-15 MED ORDER — LORAZEPAM 2 MG/ML IJ SOLN
2.0000 mg | Freq: Once | INTRAMUSCULAR | Status: DC
  Filled 2018-11-15: qty 1

## 2018-11-15 MED ORDER — ZIPRASIDONE MESYLATE 20 MG IM SOLR
20.0000 mg | Freq: Once | INTRAMUSCULAR | Status: AC
  Administered 2018-11-15: 23:00:00 20 mg via INTRAMUSCULAR

## 2018-11-15 MED ORDER — DIPHENHYDRAMINE HCL 50 MG/ML IJ SOLN
50.0000 mg | Freq: Once | INTRAMUSCULAR | Status: AC
  Administered 2018-11-15: 50 mg via INTRAMUSCULAR

## 2018-11-15 MED ORDER — LORAZEPAM 2 MG/ML IJ SOLN
2.0000 mg | Freq: Once | INTRAMUSCULAR | Status: AC
  Administered 2018-11-15: 2 mg via INTRAMUSCULAR

## 2018-11-15 NOTE — ED Notes (Signed)
Informed by [employer] that pt's dog is with her friend named, Tanner.

## 2018-11-15 NOTE — ED Triage Notes (Signed)
Pt bib Tonya Le and was at [facility] being disruptive.  Tonya Le states that pt was saying she wanted to die and brought pt to ED. On arrival to ED, pt was screaming and spitting on staff.  Dr. Laverta Baltimore at bedside and IVC'D pt. Pt states that if she had money she would buy herself a gun and kill herself.

## 2018-11-15 NOTE — ED Provider Notes (Signed)
Emergency Department Provider Note   I have reviewed the triage vital signs and the nursing notes.   HISTORY  Chief Complaint Suicidal   HPI Tonya Le is a 28 y.o. female with PMH of PTSD, substance abuse, and depression presents to the emergency department with police in restraints.  Please were called to a local bar where the patient was being very erratic, disruptive.  On scene, the patient became confrontational and continued to be erratic.  According to police she made repetitive suicidal statements about "wanting to be dead" and "just kill me."  She required restraint and spit in the face of officers.  On arrival to the emergency department patient is verbally abusive and continues to require restraint.  I was able to redirect her slightly and engage her in a brief conversation.  She states that she was "head butted by a dog" which started all of this. Denies drugs. Tells me that she drank two mimosas.    Level 5 caveat: Acute agitation   Past Medical History:  Diagnosis Date  . Anorexia nervosa   . Anxiety   . Depression   . Genital warts   . PTSD (post-traumatic stress disorder)   . Substance abuse (HCC)    multiple  . UTI (urinary tract infection)     Patient Active Problem List   Diagnosis Date Noted  . 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 Substance abuse in remission (HCC) 04/23/2017  . Nicotine dependence with current use 04/23/2017  . PTSD (post-traumatic stress disorder) 04/23/2017  . Encounter for birth control pills maintenance 07/11/2014  . 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 Eating disorder 07/11/2014    Past Surgical History:  Procedure Laterality Date  . APPENDECTOMY      Allergies Patient has no known allergies.  Family History  Problem Relation Age of Onset  . Heart attack Mother   . Early death Mother   . Mental illness Mother   . Alcohol abuse Maternal Grandmother   . Stroke Maternal Grandmother   .  Bipolar disorder Maternal Grandmother   . Alcohol abuse Maternal Grandfather   . Lung cancer Maternal Grandfather   . Alcohol abuse Paternal Grandmother   . Alcohol abuse Paternal Grandfather   . Hypertension Paternal Grandfather     Social History Social History   Tobacco Use  . Smoking status: Current Every Day Smoker    Packs/day: 0.33    Years: 10.00    Pack years: 3.30  . Smokeless tobacco: Never Used  Substance Use Topics  . Alcohol use: Yes    Alcohol/week: 2.0 standard drinks    Types: 2 Cans of beer per week  . Drug use: Yes    Frequency: 5.0 times per week    Types: Marijuana    Review of Systems  Level 5 caveat: Agitation.   ____________________________________________   PHYSICAL EXAM:  VITAL SIGNS: ED Triage Vitals  Enc Vitals Group     BP 11/15/18 2240 122/72     Pulse Rate 11/15/18 2240 (!) 104     Resp 11/15/18 2240 20     Temp --      Temp src --      SpO2 11/15/18 2240 97 %   Constitutional: Patient arrives screaming, fighting, and spitting.  Eyes: Conjunctivae are normal. Pupils are 43mm and reactive bilaterally. Head: Atraumatic. Nose: No congestion/rhinnorhea. Mouth/Throat: Mucous membranes are moist.   Neck: No stridor.   Cardiovascular: Tachycardia. Good peripheral circulation. Grossly normal heart  sounds.   Respiratory: Lungs CTAB. Gastrointestinal: Soft and nontender. No distention.  Musculoskeletal: No lower extremity tenderness nor edema. No gross deformities of extremities. Neurologic:  Normal speech and language. No gross focal neurologic deficits are appreciated.  Skin:  Skin is warm and moist. No rash noted. Psychiatric: Acute agitation and combative.   ____________________________________________   LABS (all labs ordered are listed, but only abnormal results are displayed)  Labs Reviewed  ACETAMINOPHEN LEVEL - Abnormal; Notable for the following components:      Result Value   Acetaminophen (Tylenol), Serum <10 (*)     All other components within normal limits  COMPREHENSIVE METABOLIC PANEL - Abnormal; Notable for the following components:   Potassium 3.1 (*)    CO2 19 (*)    All other components within normal limits  ETHANOL - Abnormal; Notable for the following components:   Alcohol, Ethyl (B) 269 (*)    All other components within normal limits  CBC WITH DIFFERENTIAL/PLATELET - Abnormal; Notable for the following components:   WBC 11.9 (*)    Abs Immature Granulocytes 0.12 (*)    All other components within normal limits  URINALYSIS, ROUTINE W REFLEX MICROSCOPIC - Abnormal; Notable for the following components:   Color, Urine STRAW (*)    Specific Gravity, Urine 1.003 (*)    Hgb urine dipstick SMALL (*)    Bacteria, UA FEW (*)    All other components within normal limits  RAPID URINE DRUG SCREEN, HOSP PERFORMED - Abnormal; Notable for the following components:   Tetrahydrocannabinol POSITIVE (*)    All other components within normal limits  LITHIUM LEVEL - Abnormal; Notable for the following components:   Lithium Lvl 0.56 (*)    All other components within normal limits  SALICYLATE LEVEL  CK  RAPID HIV SCREEN (HIV 1/2 AB+AG)  HEPATITIS PANEL, ACUTE  I-STAT BETA HCG BLOOD, ED ([facility], [facility], [facility] ONLY)   ____________________________________________  RADIOLOGY  None ____________________________________________   PROCEDURES  Procedure(s) performed:   Procedures  CRITICAL CARE Performed by: Margette Fast Total critical care time: 35 minutes Critical care time was exclusive of separately billable procedures and treating other patients. Critical care was necessary to treat or prevent imminent or life-threatening deterioration. Critical care was time spent personally by me on the following activities: development of treatment plan with patient and/or surrogate as well as nursing, discussions with consultants, evaluation of patient's response to treatment, examination of patient, obtaining  history from patient or surrogate, ordering and performing treatments and interventions, ordering and review of laboratory studies, ordering and review of radiographic studies, pulse oximetry and re-evaluation of patient's condition.  Nanda Quinton, MD Emergency Medicine  ____________________________________________   INITIAL IMPRESSION / ASSESSMENT AND PLAN / ED COURSE  Pertinent labs & imaging results that were available during my care of the patient were reviewed by me and considered in my medical decision making (see chart for details).   Patient presents to the emergency department with acute agitation.  She does have history of psychiatric illness as well as substance abuse.  She is having to be physically restrained.  She is making suicidal statements to police in route as well as in the emergency department.  IVC paperwork completed and filed.  Patient had to be chemically restrained with Geodon, Ativan, Benadryl.  Will obtain screening lab work including CK.  Temp on arrival is 99.4 F.   Labs pending. Patient sedated and on monitor in plain view of staff. Labs pending.   Care  transferred to Dr. Manus Gunningancour.  ____________________________________________  FINAL CLINICAL IMPRESSION(S) / ED DIAGNOSES  Final diagnoses:  Suicidal ideation  Alcoholic intoxication without complication (HCC)     MEDICATIONS GIVEN DURING THIS VISIT:  Medications  potassium chloride SA (KLOR-CON) CR tablet 40 mEq (40 mEq Oral Not Given 11/16/18 0202)  fluvoxaMINE (LUVOX) tablet 100 mg (100 mg Oral Given 11/16/18 1053)  lamoTRIgine (LAMICTAL) tablet 100 mg (100 mg Oral Given 11/16/18 1036)  traZODone (DESYREL) tablet 100 mg (100 mg Oral Not Given 11/16/18 0203)  LORazepam (ATIVAN) injection 0-4 mg (0 mg Intravenous Not Given 11/16/18 0800)    Or  LORazepam (ATIVAN) tablet 0-4 mg ( Oral See Alternative 11/16/18 0800)  LORazepam (ATIVAN) injection 0-4 mg (has no administration in time range)    Or   LORazepam (ATIVAN) tablet 0-4 mg (has no administration in time range)  thiamine (VITAMIN B-1) tablet 100 mg (100 mg Oral Given 11/16/18 1037)    Or  thiamine (B-1) injection 100 mg ( Intravenous See Alternative 11/16/18 1037)  propranolol (INDERAL) tablet 80 mg (has no administration in time range)  lithium carbonate (ESKALITH) CR tablet 900 mg (has no administration in time range)  ziprasidone (GEODON) injection 20 mg (20 mg Intramuscular Given 11/15/18 2230)  LORazepam (ATIVAN) injection 2 mg (2 mg Intramuscular Given 11/15/18 2230)  diphenhydrAMINE (BENADRYL) injection 50 mg (50 mg Intramuscular Given 11/15/18 2230)  sterile water (preservative free) injection (1.2 mLs  Given 11/15/18 2231)    Note:  This document was prepared using Dragon voice recognition software and may include unintentional dictation errors.  Alona BeneJoshua Marteze Vecchio, MD, [facility] Emergency [facility]    Rhaya Coale, Arlyss RepressJoshua G, MD 11/16/18 [phone]

## 2018-11-16 ENCOUNTER — Other Ambulatory Visit: Payer: Self-pay

## 2018-11-16 ENCOUNTER — Encounter (HOSPITAL_COMMUNITY): Payer: Self-pay | Admitting: Registered Nurse

## 2018-11-16 DIAGNOSIS — F1094 Alcohol use, unspecified with alcohol-induced mood disorder: Secondary | ICD-10-CM

## 2018-11-16 LAB — URINALYSIS, ROUTINE W REFLEX MICROSCOPIC
Bilirubin Urine: NEGATIVE
Glucose, UA: NEGATIVE mg/dL
Ketones, ur: NEGATIVE mg/dL
Leukocytes,Ua: NEGATIVE
Nitrite: NEGATIVE
Protein, ur: NEGATIVE mg/dL
Specific Gravity, Urine: 1.003 — ABNORMAL LOW (ref 1.005–1.030)
pH: 7 (ref 5.0–8.0)

## 2018-11-16 LAB — RAPID URINE DRUG SCREEN, HOSP PERFORMED
Amphetamines: NOT DETECTED
Barbiturates: NOT DETECTED
Benzodiazepines: NOT DETECTED
Cocaine: NOT DETECTED
Opiates: NOT DETECTED
Tetrahydrocannabinol: POSITIVE — AB

## 2018-11-16 LAB — HEPATITIS PANEL, ACUTE
HCV Ab: NONREACTIVE
Hep A IgM: NONREACTIVE
Hep B C IgM: NONREACTIVE
Hepatitis B Surface Ag: NONREACTIVE

## 2018-11-16 MED ORDER — LORAZEPAM 1 MG PO TABS: 0.0000 mg | ORAL_TABLET | Freq: Two times a day (BID) | ORAL | Status: DC

## 2018-11-16 MED ORDER — LAMOTRIGINE 100 MG PO TABS
100.0000 mg | ORAL_TABLET | Freq: Two times a day (BID) | ORAL | Status: DC
  Administered 2018-11-16: 100 mg via ORAL
  Filled 2018-11-16: qty 1

## 2018-11-16 MED ORDER — PROPRANOLOL HCL 80 MG PO TABS
80.0000 mg | ORAL_TABLET | Freq: Every day | ORAL | Status: DC
  Filled 2018-11-16: qty 1

## 2018-11-16 MED ORDER — LITHIUM CARBONATE ER 450 MG PO TBCR: 900.0000 mg | EXTENDED_RELEASE_TABLET | Freq: Every day | ORAL | Status: DC

## 2018-11-16 MED ORDER — BUSPIRONE HCL 10 MG PO TABS
30.0000 mg | ORAL_TABLET | Freq: Three times a day (TID) | ORAL | Status: DC
  Administered 2018-11-16: 11:00:00 30 mg via ORAL
  Filled 2018-11-16: qty 3

## 2018-11-16 MED ORDER — TRAZODONE HCL 100 MG PO TABS: 100.0000 mg | ORAL_TABLET | Freq: Every day | ORAL | Status: DC

## 2018-11-16 MED ORDER — LORAZEPAM 1 MG PO TABS: 0.0000 mg | ORAL_TABLET | Freq: Four times a day (QID) | ORAL | Status: DC

## 2018-11-16 MED ORDER — VITAMIN B-1 100 MG PO TABS
100.0000 mg | ORAL_TABLET | Freq: Every day | ORAL | Status: DC
  Administered 2018-11-16: 100 mg via ORAL
  Filled 2018-11-16: qty 1

## 2018-11-16 MED ORDER — PRAZOSIN HCL 1 MG PO CAPS
2.0000 mg | ORAL_CAPSULE | Freq: Every day | ORAL | Status: DC
  Filled 2018-11-16: qty 2

## 2018-11-16 MED ORDER — POTASSIUM CHLORIDE CRYS ER 20 MEQ PO TBCR
40.0000 meq | EXTENDED_RELEASE_TABLET | Freq: Once | ORAL | Status: DC
  Filled 2018-11-16: qty 2

## 2018-11-16 MED ORDER — NALTREXONE HCL 50 MG PO TABS
50.0000 mg | ORAL_TABLET | Freq: Every day | ORAL | Status: DC
  Administered 2018-11-16: 50 mg via ORAL
  Filled 2018-11-16: qty 1

## 2018-11-16 MED ORDER — THIAMINE HCL 100 MG/ML IJ SOLN
100.0000 mg | Freq: Every day | INTRAMUSCULAR | Status: DC
  Filled 2018-11-16: qty 2

## 2018-11-16 MED ORDER — LORAZEPAM 2 MG/ML IJ SOLN: 0.0000 mg | Freq: Two times a day (BID) | INTRAMUSCULAR | Status: DC

## 2018-11-16 MED ORDER — FLUVOXAMINE MALEATE 50 MG PO TABS
100.0000 mg | ORAL_TABLET | Freq: Three times a day (TID) | ORAL | Status: DC
  Administered 2018-11-16: 100 mg via ORAL
  Filled 2018-11-16 (×3): qty 2

## 2018-11-16 MED ORDER — LORAZEPAM 2 MG/ML IJ SOLN: 0.0000 mg | Freq: Four times a day (QID) | INTRAMUSCULAR | Status: DC

## 2018-11-16 NOTE — Patient Outreach (Signed)
CPSS met with the patient in order to provide substance use recovery support and help with getting connected to substance use treatment resources. Patient reports a history of alcohol use. Patient is interested in non-12 step support groups as well as non-12 step focused outpatient substance use treatment. Patient had achieved over month of continuous sobriety before relapsing last night 11/15/18. CPSS talked to the patient about the [facility] dual diagnosis support group and their dual diagnosis counseling services. CPSS also talked to the patient about [facility]'s dual diagnosis outpatient substance use treatment program. CPSS provided a flier detailing the dual diagnosis treatment services for both of these resources. CPSS also provided an outpatient/residential substance use treatment center list and CPSS contact information. CPSS strongly encouraged the patient to follow up with CPSS if needed for further help with getting connected to substance use recovery resources.

## 2018-11-16 NOTE — BHH Counselor (Signed)
TTS counselor spoke with nurse secretary Estill Bamberg. Estill Bamberg reported that pt can not complete assessment at this time. Pt was given Geodon meds around 10:19. TTS will attempt to complete assessment at later time.

## 2018-11-16 NOTE — BH Assessment (Signed)
[place] Assessment Progress Note This Probation officer spoke with [name] [phone] to gather collateral information. Patient reports she currently resides with partner. [name] has no concerns in reference to patient's safety after being discharged. Partner states that he will assist with supporting patient after discharge to ensure she follows up with OP care. Patient contracts for safety at this time and is interested in meeting with peer support prior to discharge to assist with current SA issues. Patient reports that if she has thoughts of self harm in the future she will contact emergency services. Patient will continue to follow up with her current OP provider on discharge.

## 2018-11-16 NOTE — ED Provider Notes (Signed)
Care assumed from Dr. Laverta Baltimore.  Patient brought in by police with disruptive behavior and agitation.  She required physical and chemical sedation on arrival.  Her labs are pending.  She will need psychiatric evaluation.  Labs show hypokalemia as well as alcohol intoxication.  Anion gap is normal.  Lithium level subtherapeutic. Drug screen positive for THC only.  Patient resting comfortably on recheck.  Vitals are stable.  She is medically clear for psychiatric evaluation.  Holding orders placed.   Ezequiel Essex, MD 11/16/18 Reece Agar

## 2018-11-16 NOTE — Consult Note (Signed)
[facility]  11/16/2018 1:33 PM Tonya Le  MRN:  [id number] Principal Problem: Alcohol-induced mood disorder [facility](HCC) Discharge Diagnoses: Principal Problem:   Alcohol-induced mood disorder (HCC) Active Problems:   Major depression in remission [facility](HCC)   Substance abuse in remission (HCC)   PTSD (post-traumatic stress disorder)   Subjective:   Tonya Le, 28 y.o., female patient seen via tele psych by this provider, Dr. Lucianne MussKumar; and chart reviewed on 11/16/18.  On evaluation Tonya Le reports she was intoxicated and doesn't remember what she said last night.  Patient denies suicidal/self-harm/homicidal ideation, psychosis, and paranoia.  Patient states that she has outpatient services at [facility], and compliant with medications.,  Patient states that she is fine now and ready to go home.   During evaluation Tonya Le is alert/oriented x 4; calm/cooperative; and mood is congruent with affect.  She does not appear to be responding to internal/external stimuli or delusional thoughts.  Patient denies suicidal/self-harm/homicidal ideation, psychosis, and paranoia.  Patient answered question appropriately.     Total Time spent with patient: 30 minutes  Past Psychiatric History: MDD, GAD, PTSD  Past Medical History:  Past Medical History:  Diagnosis Date  . Anorexia nervosa   . Anxiety   . Depression   . Genital warts   . PTSD (post-traumatic stress disorder)   . Substance abuse (HCC)    multiple  . UTI (urinary tract infection)     Past Surgical History:  Procedure Laterality Date  . APPENDECTOMY     Family History:  Family History  Problem Relation Age of Onset  . Heart attack Mother   . Early death Mother   . Mental illness Mother   . Alcohol abuse Maternal Grandmother   . Stroke Maternal Grandmother   . Bipolar disorder Maternal Grandmother   . Alcohol abuse Maternal Grandfather   . Lung cancer Maternal Grandfather   . Alcohol  abuse Paternal Grandmother   . Alcohol abuse Paternal Grandfather   . Hypertension Paternal Grandfather    Family Psychiatric  History: See above Social History:  Social History   Substance and Sexual Activity  Alcohol Use Yes  . Alcohol/week: 2.0 standard drinks  . Types: 2 Cans of beer per week     Social History   Substance and Sexual Activity  Drug Use Yes  . Frequency: 5.0 times per week  . Types: Marijuana    Social History   Socioeconomic History  . Marital status: Single    Spouse name: Not on file  . Number of children: 0  . Years of education: 7816  . Highest education level: Not on file  Occupational History  . Occupation: Academic librarianerver  Social Needs  . Financial resource strain: Not on file  . Food insecurity    Worry: Not on file    Inability: Not on file  . Transportation needs    Medical: Not on file    Non-medical: Not on file  Tobacco Use  . Smoking status: Current Every Day Smoker    Packs/day: 0.33    Years: 10.00    Pack years: 3.30  . Smokeless tobacco: Never Used  Substance and Sexual Activity  . Alcohol use: Yes    Alcohol/week: 2.0 standard drinks    Types: 2 Cans of beer per week  . Drug use: Yes    Frequency: 5.0 times per week    Types: Marijuana  . Sexual activity: Yes    Birth control/protection: Pill  Lifestyle  .  Physical activity    Days per week: Not on file    Minutes per session: Not on file  . Stress: Not on file  Relationships  . Social Musician on phone: Not on file    Gets together: Not on file    Attends religious service: Not on file    Active member of club or organization: Not on file    Attends meetings of clubs or organizations: Not on file    Relationship status: Not on file  Other Topics Concern  . Not on file  Social History Narrative   Fun: Sleep, play her dog   Denies religious beliefs effecting health care.   Originally from [place].    Denies abuse and feels safe at home    Has this patient  used any form of tobacco in the last 30 days? (Cigarettes, Smokeless Tobacco, Cigars, and/or Pipes) A prescription for an FDA-approved tobacco cessation medication was offered at discharge and the patient refused  Current Medications: Current Facility-Administered Medications  Medication Dose Route Frequency Provider Last Rate Last Dose  . fluvoxaMINE (LUVOX) tablet 100 mg  100 mg Oral TID Glynn Octave, MD   100 mg at 11/16/18 1053  . lamoTRIgine (LAMICTAL) tablet 100 mg  100 mg Oral BID Rancour, Jeannett Senior, MD   100 mg at 11/16/18 1036  . lithium carbonate (ESKALITH) CR tablet 900 mg  900 mg Oral QHS Nanavati, Ankit, MD      . LORazepam (ATIVAN) injection 0-4 mg  0-4 mg Intravenous Q6H Rancour, Jeannett Senior, MD       Or  . LORazepam (ATIVAN) tablet 0-4 mg  0-4 mg Oral Q6H Rancour, Stephen, MD      . Melene Muller ON 11/18/2018] LORazepam (ATIVAN) injection 0-4 mg  0-4 mg Intravenous Q12H Rancour, Stephen, MD       Or  . Melene Muller ON 11/18/2018] LORazepam (ATIVAN) tablet 0-4 mg  0-4 mg Oral Q12H Rancour, Stephen, MD      . potassium chloride SA (KLOR-CON) CR tablet 40 mEq  40 mEq Oral Once Rancour, Stephen, MD      . propranolol (INDERAL) tablet 80 mg  80 mg Oral QHS Nanavati, Ankit, MD      . thiamine (VITAMIN B-1) tablet 100 mg  100 mg Oral Daily Rancour, Stephen, MD   100 mg at 11/16/18 1037   Or  . thiamine (B-1) injection 100 mg  100 mg Intravenous Daily Rancour, Stephen, MD      . traZODone (DESYREL) tablet 100 mg  100 mg Oral QHS Rancour, Jeannett Senior, MD       Current Outpatient Medications  Medication Sig Dispense Refill  . fluvoxaMINE (LUVOX) 100 MG tablet Take 300 mg by mouth at bedtime.     . gabapentin (NEURONTIN) 300 MG capsule Take 300 mg by mouth at bedtime as needed (For insomnia.).     Marland Kitchen lamoTRIgine (LAMICTAL) 200 MG tablet Take 200 mg by mouth at bedtime.     Marland Kitchen lithium carbonate (LITHOBID) 300 MG CR tablet Take 900 mg by mouth at bedtime.    . propranolol (INDERAL) 40 MG tablet Take 80 mg  by mouth at bedtime.    . traZODone (DESYREL) 50 MG tablet Take 50-100 mg by mouth at bedtime.     PTA Medications: (Not in a hospital admission)   Musculoskeletal: Strength & Muscle Tone: within normal limits Gait & Station: normal Patient leans: N/A  Psychiatric Specialty Exam: Physical Exam  Nursing note and vitals reviewed.  Constitutional: She is oriented to person, place, and time. She appears well-nourished. No distress.  Neck: Normal range of motion.  Respiratory: Effort normal.  Musculoskeletal: Normal range of motion.  Neurological: She is alert and oriented to person, place, and time.    Review of Systems  Psychiatric/Behavioral: Positive for substance abuse. Depression: Stable. Hallucinations: Denies. Memory loss: Denies. Suicidal ideas: Denies. Nervous/anxious: Stable. Insomnia: Denies.   All other systems reviewed and are negative.   Blood pressure 103/68, pulse 72, temperature 98.7 F (37.1 C), temperature source Oral, resp. rate 18, SpO2 99 %.There is no height or weight on file to calculate BMI.  General Appearance: Casual  Eye Contact:  Good  Speech:  Clear and Coherent and Normal Rate  Volume:  Normal  Mood:  "Good" Appropriate  Affect:  Appropriate and Congruent  Thought Process:  Coherent and Goal Directed  Orientation:  Full (Time, Place, and Person)  Thought Content:  WDL  Suicidal Thoughts:  No  Homicidal Thoughts:  No  Memory:  Immediate;   Good Recent;   Good  Judgement:  Intact  Insight:  Present  Psychomotor Activity:  Normal  Concentration:  Concentration: Good and Attention Span: Good  Recall:  Good  Fund of Knowledge:  Good  Language:  Good  Akathisia:  No  Handed:  Right  AIMS (if indicated):     Assets:  Communication Skills Desire for Improvement Housing Physical Health Social Support  ADL's:  Intact  Cognition:  WNL  Sleep:        Demographic Factors:  Caucasian  Loss Factors: NA  Historical Factors: NA  Risk  Reduction Factors:   Religious beliefs about death, Living with another person, especially a relative, Positive social support and Positive therapeutic relationship  Continued Clinical Symptoms:  Alcohol/Substance Abuse/Dependencies Previous Psychiatric Diagnoses and Treatments  Cognitive Features That Contribute To Risk:  None    Suicide Risk:  Minimal: No identifiable suicidal ideation.  Patients presenting with no risk factors but with morbid ruminations; may be classified as minimal risk based on the severity of the depressive symptoms    Plan Of Care/Follow-up recommendations:  Activity:  As tolerated Diet:  Heart healthy  Disposition: No evidence of imminent risk to self or others at present.   Patient does not meet criteria for psychiatric inpatient admission. Supportive therapy provided about ongoing stressors. Discussed crisis plan, support from social network, calling 911, coming to the Emergency Department, and calling Suicide Hotline.  Debraann Livingstone, NP 11/16/2018, 1:33 PM
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
## 2018-11-16 NOTE — ED Notes (Signed)
Pt discharged safely.  Resources were given.  Discharge instructions were reviewed.
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
## 2018-11-16 NOTE — Discharge Instructions (Signed)
For your behavioral health needs, you are advised to continue treatment with the [facility]: ° °     [facility] °     [address],  °     [place], [place] [address] °     [phone]

## 2018-11-16 NOTE — ED Notes (Signed)
Pt was very cooperative and polite while walking to and from the restroom to provide a urine specimen.

## 2018-11-16 NOTE — BH Assessment (Signed)
[facility] Assessment Progress Note  Per Hampton Abbot, MD, this pt does not require psychiatric hospitalization at this time.  Pt presents under IVC initiated by EDP Nanda Quinton, MD, which Dr Dwyane Dee has rescinded.  Pt is to be discharged from [facility] with recommendation to continue treatment at the [facility].  This has been included in pt's discharge instructions.  Pt's nurse, Nena Jordan, has been notified.  Jalene Mullet, [facility] Triage Specialist [phone]

## 2018-12-16 ENCOUNTER — Other Ambulatory Visit: Payer: Self-pay

## 2018-12-16 DIAGNOSIS — Z20822 Contact with and (suspected) exposure to covid-19: Secondary | ICD-10-CM

## 2018-12-18 LAB — NOVEL CORONAVIRUS, NAA: SARS-CoV-2, NAA: NOT DETECTED

## 2020-01-13 ENCOUNTER — Ambulatory Visit (HOSPITAL_COMMUNITY)
Admission: EM | Admit: 2020-01-13 | Discharge: 2020-01-13 | Disposition: A | Discharge status: Home / Self Care | Payer: 59

## 2020-01-13 ENCOUNTER — Emergency Department (HOSPITAL_COMMUNITY)
Admission: EM | Admit: 2020-01-13 | Discharge: 2020-01-13 | Disposition: A | Discharge status: Home / Self Care | Payer: 59 | Attending: Emergency Medicine | Admitting: Emergency Medicine

## 2020-01-13 ENCOUNTER — Emergency Department (HOSPITAL_COMMUNITY): Payer: 59

## 2020-01-13 ENCOUNTER — Encounter (HOSPITAL_COMMUNITY): Payer: Self-pay

## 2020-01-13 ENCOUNTER — Other Ambulatory Visit: Payer: Self-pay

## 2020-01-13 DIAGNOSIS — W540XXA Bitten by dog, initial encounter: Secondary | ICD-10-CM | POA: Diagnosis not present

## 2020-01-13 DIAGNOSIS — Z23 Encounter for immunization: Secondary | ICD-10-CM | POA: Insufficient documentation

## 2020-01-13 DIAGNOSIS — S62665B Nondisplaced fracture of distal phalanx of left ring finger, initial encounter for open fracture: Secondary | ICD-10-CM | POA: Insufficient documentation

## 2020-01-13 DIAGNOSIS — S61451A Open bite of right hand, initial encounter: Secondary | ICD-10-CM | POA: Insufficient documentation

## 2020-01-13 DIAGNOSIS — S60945A Unspecified superficial injury of left ring finger, initial encounter: Secondary | ICD-10-CM | POA: Diagnosis present

## 2020-01-13 DIAGNOSIS — F17210 Nicotine dependence, cigarettes, uncomplicated: Secondary | ICD-10-CM | POA: Insufficient documentation

## 2020-01-13 DIAGNOSIS — S61452A Open bite of left hand, initial encounter: Secondary | ICD-10-CM

## 2020-01-13 DIAGNOSIS — S61309A Unspecified open wound of unspecified finger with damage to nail, initial encounter: Secondary | ICD-10-CM

## 2020-01-13 MED ORDER — TETANUS-DIPHTH-ACELL PERTUSSIS 5-2.5-18.5 LF-MCG/0.5 IM SUSY
0.5000 mL | PREFILLED_SYRINGE | Freq: Once | INTRAMUSCULAR | Status: AC
  Administered 2020-01-13: 0.5 mL via INTRAMUSCULAR
  Filled 2020-01-13: qty 0.5

## 2020-01-13 MED ORDER — AMOXICILLIN-POT CLAVULANATE 875-125 MG PO TABS
1.0000 | ORAL_TABLET | Freq: Once | ORAL | Status: AC
  Administered 2020-01-13: 1 via ORAL
  Filled 2020-01-13: qty 1

## 2020-01-13 MED ORDER — LIDOCAINE-EPINEPHRINE 1 %-1:100000 IJ SOLN
20.0000 mL | Freq: Once | INTRAMUSCULAR | Status: DC
  Filled 2020-01-13: qty 1

## 2020-01-13 MED ORDER — AMOXICILLIN-POT CLAVULANATE 875-125 MG PO TABS: 1.0000 | ORAL_TABLET | Freq: Two times a day (BID) | ORAL | 0 refills | Status: DC

## 2020-01-13 MED ORDER — OXYCODONE-ACETAMINOPHEN 5-325 MG PO TABS
1.0000 | ORAL_TABLET | Freq: Once | ORAL | Status: AC
  Administered 2020-01-13: 1 via ORAL
  Filled 2020-01-13: qty 1

## 2020-01-13 MED ORDER — IBUPROFEN 600 MG PO TABS: 600.0000 mg | ORAL_TABLET | Freq: Four times a day (QID) | ORAL | 0 refills | Status: DC | PRN

## 2020-01-13 NOTE — Discharge Instructions (Signed)
You have been evaluated for dog bites.  You have an open fracture involving her left ring finger.  You also have multiple wounds on both hands.  Please cleanse wound daily with gentle soap and water.  Pat dry, and apply Neosporin and dressing over the wound to decrease risk of infection.  Please change dressing daily.  Take antibiotic as prescribed.  Monitor for any signs of infection such as increasing pain increasing redness and pus draining out from the wound.  If you notice any signs of infection please return for further evaluation.  You will need to wear finger splint for approximately 4 to 6 weeks.

## 2020-01-13 NOTE — ED Provider Notes (Signed)
[facility] Provider Note   CSN: [id number] Arrival date & time: 01/13/20  1039     History Chief Complaint  Patient presents with  . Animal Bite  . attacked by dogs    Tonya Neighborsnna Ines is a 29 y.o. female.  The history is provided by the patient. No language interpreter was used.  Animal Bite Contact animal:  Dog Associated symptoms: no fever and no numbness      29 year old female significant history of polysubstance abuse in remission, PTSD, anxiety, sent here from urgent care center for evaluation of animal bites.  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  She tries to separate them and in the process she was bitten on both of her hands.  Incident happened approximately an hour ago.  She report acute onset of sharp pain to both hands from the bites, rates pain a 7 out of 10.  States one of her fingernails were ripped off in the process.  She denies any associated numbness.  Described pain as sharp throbbing nonradiating worse with movement.  She mention her dogs are fully vaccinated..  She is not up-to-date with tetanus.  She did try rinsing hands initially.  She went to urgent care however was sent here for further care.   Past Medical History:  Diagnosis Date  . Anorexia nervosa   . Anxiety   . Depression   . Genital warts   . PTSD (post-traumatic stress disorder)   . Substance abuse (HCC)    multiple  . UTI (urinary tract infection)     Patient Active Problem List   Diagnosis Date Noted  . 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 Substance abuse in remission (HCC) 04/23/2017  . Nicotine dependence with current use 04/23/2017  . PTSD (post-traumatic stress disorder) 04/23/2017  . Encounter for birth control pills maintenance 07/11/2014  . 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 Eating disorder 07/11/2014    Past Surgical History:  Procedure Laterality Date  .  APPENDECTOMY       OB History   No obstetric history on file.     Family History  Problem Relation Age of Onset  . Heart attack Mother   . Early death Mother   . Mental illness Mother   . Alcohol abuse Maternal Grandmother   . Stroke Maternal Grandmother   . Bipolar disorder Maternal Grandmother   . Alcohol abuse Maternal Grandfather   . Lung cancer Maternal Grandfather   . Alcohol abuse Paternal Grandmother   . Alcohol abuse Paternal Grandfather   . Hypertension Paternal Grandfather     Social History   Tobacco Use  . Smoking status: Current Every Day Smoker    Packs/day: 0.33    Years: 10.00    Pack years: 3.30  . Smokeless tobacco: Never Used  Substance Use Topics  . Alcohol use: Yes    Alcohol/week: 2.0 standard drinks    Types: 2 Cans of beer per week  . Drug use: Yes    Frequency: 5.0 times per week    Types: Marijuana    Home Medications Prior to Admission medications   Medication Sig Start Date End Date Taking? Authorizing Provider  fluvoxaMINE (LUVOX) 100 MG tablet Take 300 mg by mouth at bedtime.     [provider]  gabapentin (NEURONTIN) 300 MG capsule Take 300 mg by mouth at bedtime as needed (For insomnia.).  09/17/18   [provider]  lamoTRIgine (LAMICTAL) 200 MG tablet Take 200 mg by mouth at bedtime.  09/14/18   [provider]  lithium carbonate (LITHOBID) 300 MG CR tablet Take 900 mg by mouth at bedtime. 10/05/18   [provider]  propranolol (INDERAL) 40 MG tablet Take 80 mg by mouth at bedtime.    [provider]  traZODone (DESYREL) 50 MG tablet Take 50-100 mg by mouth at bedtime. 11/13/18   [provider]    Allergies    Patient has no known allergies.  Review of Systems   Review of Systems  Constitutional: Negative for fever.  Skin: Positive for wound.  Neurological: Negative for numbness.  All other systems reviewed and are negative.   Physical Exam Updated Vital Signs BP  112/75   Pulse 74   Temp 98.5 F (36.9 C) (Oral)   Resp 20   Ht 5\' 3"  (1.6 m)   Wt 59 kg   SpO2 99%   BMI 23.03 kg/m   Physical Exam Vitals and nursing note reviewed.  Constitutional:      General: She is not in acute distress.    Appearance: She is well-developed and well-nourished.  HENT:     Head: Atraumatic.  Eyes:     Conjunctiva/sclera: Conjunctivae normal.  Musculoskeletal:        General: Signs of injury (Bilateral hands: Multiple puncture wounds noted to both dorsal and volar aspects of the hands and wrist with complete nail avulsion to left ring finger.  Please refer to pictures below) present.     Cervical back: Neck supple.  Skin:    Findings: No rash.  Neurological:     Mental Status: She is alert.  Psychiatric:        Mood and Affect: Mood and affect normal.         ED Results / Procedures / Treatments   Labs (all labs ordered are listed, but only abnormal results are displayed) Labs Reviewed - No data to display  EKG None  Radiology No results found.  Procedures .Ortho Injury Treatment  Date/Time: 01/13/2020 2:38 PM Performed by: 14/10/2019, PA-C Authorized by: Tonya Helper, PA-C   Consent:    Consent obtained:  Verbal   Consent given by:  Patient   Risks discussed:  Nerve damage   Alternatives discussed:  Alternative treatmentInjury location: finger Location details: left ring finger Injury type: fracture Fracture type: distal phalanx MCP joint involved: no IP joint involved: no Pre-procedure neurovascular assessment: neurovascularly intact Pre-procedure distal perfusion: normal Pre-procedure neurological function: normal Pre-procedure range of motion: normal Anesthesia: digital block  Anesthesia: Local anesthesia used: yes Local Anesthetic: lidocaine 1% with epinephrine Anesthetic total: 3 mL  Patient sedated: NoManipulation performed: no Immobilization: splint Splint type: finger splint. Supplies used: aluminum  splint Post-procedure neurovascular assessment: post-procedure neurovascularly intact Post-procedure distal perfusion: normal Post-procedure neurological function: normal Post-procedure range of motion: normal Patient tolerance: patient tolerated the procedure well with no immediate complications Comments: Left ring finger with complete nail avulsion.  Tuft fracture noted with bone exposed.  Moderate amount of dog hairs and debris noted in the nailbed that was removed by me.  Surgical debridement of dead tissues using sterile scissor.  Wound was wrapped, and finger splint applied by me.  Tonya Le.Laceration Repair  Date/Time: 01/13/2020 2:41 PM Performed by: 14/10/2019, PA-C Authorized by: Tonya Helper, PA-C   Consent:    Consent obtained:  Verbal   Consent given by:  Patient   Risks discussed:  Infection, need for  additional repair, pain, poor cosmetic result and poor wound healing   Alternatives discussed:  No treatment and delayed treatment Universal protocol:    Procedure explained and questions answered to patient or proxy's satisfaction: yes     Relevant documents present and verified: yes     Test results available: yes     Imaging studies available: yes     Required blood products, implants, devices, and special equipment available: yes     Site/side marked: yes     Immediately prior to procedure, a time out was called: yes     Patient identity confirmed:  Verbally with patient Anesthesia:    Anesthesia method:  Local infiltration   Local anesthetic:  Lidocaine 1% WITH epi Laceration details:    Location:  Hand   Hand location:  R palm   Length (cm):  1   Depth (mm):  3 Pre-procedure details:    Preparation:  Imaging obtained to evaluate for foreign bodies and patient was prepped and draped in usual sterile fashion Exploration:    Limited defect created (wound extended): no     Hemostasis achieved with:  Epinephrine   Imaging obtained: x-ray     Imaging outcome: foreign body  not noted     Wound exploration: wound explored through full range of motion and entire depth of wound visualized     Wound extent: no underlying fracture noted     Contaminated: yes   Treatment:    Area cleansed with:  Povidone-iodine   Amount of cleaning:  Standard   Irrigation solution:  Sterile saline   Visualized foreign bodies/material removed: no     Debridement:  Minimal (Debridement of dead tissue using sterile scissor)   Undermining:  None   Scar revision: no   Repair type:    Repair type:  Simple Post-procedure details:    Dressing:  Non-adherent dressing   Procedure completion:  Tolerated   (including critical care time)  Medications Ordered in ED Medications  lidocaine-EPINEPHrine (XYLOCAINE W/EPI) 1 %-1:100000 (with pres) injection 20 mL (has no administration in time range)  Tdap (BOOSTRIX) injection 0.5 mL (0.5 mLs Intramuscular Given 01/13/20 1157)  oxyCODONE-acetaminophen (PERCOCET/ROXICET) 5-325 MG per tablet 1 tablet (1 tablet Oral Given 01/13/20 1156)  amoxicillin-clavulanate (AUGMENTIN) 875-125 MG per tablet 1 tablet (1 tablet Oral Given 01/13/20 1156)    ED Course  I have reviewed the triage vital signs and the nursing notes.  Pertinent labs & imaging results that were available during my care of the patient were reviewed by me and considered in my medical decision making (see chart for details).    MDM Rules/Calculators/A&P                          BP 122/64 (BP Location: Right Arm)   Pulse 66   Temp 98.5 F (36.9 C) (Oral)   Resp 18   Ht 5\' 3"  (1.6 m)   Wt 59 kg   LMP 12/14/2019   SpO2 97%   BMI 23.03 kg/m   Final Clinical Impression(s) / ED Diagnoses Final diagnoses:  Dog bite of right hand, initial encounter  Dog bite of left hand, initial encounter  Open nondisplaced fracture of distal phalanx of left ring finger, initial encounter  Traumatic avulsion of nail plate of finger, initial encounter    Rx / DC Orders ED Discharge Orders          Ordered    ibuprofen (ADVIL) 600 MG  tablet  Every 6 hours PRN        01/13/20 1508    amoxicillin-clavulanate (AUGMENTIN) 875-125 MG tablet  2 times daily        01/13/20 1508         11:10 AM Patient presents for evaluation of dog bites to both of her hands that happened approximately an hour ago.  It was a provoked bites as she was trying to separate her dogs from biting each other when 2 of them were in heat and another 1 was trying to join.  She does have multiple puncture wounds about the hands with complete avulsion of her left ring finger nail.  Will obtain x-ray of the hands to assess for potential bony involvement.  Will initiate Augmentin antibiotic.  Will update tetanus.  Wounds will be irrigated.  3:11 PM I have thoroughly irrigated the wounds, I have also trimmed off dead tissue on her right palm region as well as her left ring finger.  Finger splint applied to left ring finger.  Antibiotic given.  Appropriate treatment discussed.  Patient made aware that these wounds are at high risk of infection and will need to monitor very closely and keep wound cleanse daily.  And referral given as needed.  Return precaution given as well.   Tonya Helper, PA-C 01/13/20 1512    Vanetta Mulders, MD 01/14/20 [phone]

## 2020-01-13 NOTE — ED Triage Notes (Signed)
Patient sent from [facility] for further evaluation of multiple dog bites to bilateral hands with nail ripped off, abrasions to knees. Dressed prior to arrival

## 2020-01-13 NOTE — ED Notes (Signed)
Pt taken to xray 

## 2020-01-13 NOTE — ED Notes (Signed)
Hooked patient back up to the monitor patient is resting with call bell in reach 

## 2020-01-13 NOTE — ED Notes (Signed)
Got patient on the monitor patient is in a gown with call bell in reach 

## 2020-01-14 ENCOUNTER — Encounter: Payer: Self-pay | Admitting: Orthopaedic Surgery

## 2020-01-14 ENCOUNTER — Ambulatory Visit: Payer: 59 | Admitting: Orthopaedic Surgery

## 2020-01-14 ENCOUNTER — Ambulatory Visit (INDEPENDENT_AMBULATORY_CARE_PROVIDER_SITE_OTHER): Payer: 59 | Admitting: Orthopaedic Surgery

## 2020-01-14 DIAGNOSIS — S61451A Open bite of right hand, initial encounter: Secondary | ICD-10-CM

## 2020-01-14 DIAGNOSIS — W540XXA Bitten by dog, initial encounter: Secondary | ICD-10-CM | POA: Diagnosis not present

## 2020-01-14 DIAGNOSIS — S61452A Open bite of left hand, initial encounter: Secondary | ICD-10-CM

## 2020-01-14 MED ORDER — AMOXICILLIN-POT CLAVULANATE 875-125 MG PO TABS: 1.0000 | ORAL_TABLET | Freq: Two times a day (BID) | ORAL | 0 refills | Status: DC

## 2020-01-14 MED ORDER — MUPIROCIN 2 % EX OINT: 1.0000 "application " | TOPICAL_OINTMENT | Freq: Two times a day (BID) | CUTANEOUS | 2 refills | Status: DC

## 2020-01-14 NOTE — Progress Notes (Signed)
Office Visit Note   Patient: Tonya Le           Date of Birth: [birth year]           MRN: [id number] Visit Date: 01/14/2020              Requested by: No referring provider defined for this encounter. PCP: Patient, No Pcp Per   Assessment & Plan: Visit Diagnoses:  1. Dog bite, hand, left, initial encounter   2. Dog bite of right hand, initial encounter     Plan: Impression is dog bite to both hands.  She has an avulsion fracture of the distal phalanx of the left ring finger.  We will treat the wounds with Bactroban ointment and extend her antibiotics by another week.  Wound care instructions were reviewed with the patient.  Counseled to limit tobacco use.  Follow-up next week for recheck.  Follow-Up Instructions: Return in about 1 week (around 01/21/2020).   Orders:  No orders of the defined types were placed in this encounter.  Meds ordered this encounter  Medications  . amoxicillin-clavulanate (AUGMENTIN) 875-125 MG tablet    Sig: Take 1 tablet by mouth 2 (two) times daily.    Dispense:  14 tablet    Refill:  0  . mupirocin ointment (BACTROBAN) 2 %    Sig: Apply 1 application topically 2 (two) times daily.    Dispense:  22 g    Refill:  2      Procedures: No procedures performed   Clinical Data: No additional findings.   Subjective: Chief Complaint  Patient presents with  . Right Hand - Pain  . Left Hand - Pain    Tonya Le is a 29 year old female who presented to the ER yesterday for dog bites to both hands.  She sustained multiple puncture wounds to the right hand and she also had a nail avulsion injury to the left ring finger.  These wounds were irrigated at the bedside by the EDP and she was placed on Augmentin for a week and was given follow-up to Dr. Merrilee Seashore office.  The patient is seeing me today in the office because Dr. Merrilee Seashore office refused to see the patient because she had [employer] insurance and because she was never personally evaluated by  him in the ED.  Today in the office she reports moderate pain to both of her hands.  She has started her Augmentin.  She has a history of bipolar and smokes third a pack of cigarettes a day.   Review of Systems  Constitutional: Negative.   HENT: Negative.   Eyes: Negative.   Respiratory: Negative.   Cardiovascular: Negative.   Endocrine: Negative.   Musculoskeletal: Negative.   Neurological: Negative.   Hematological: Negative.   Psychiatric/Behavioral: Negative.   All other systems reviewed and are negative.    Objective: Vital Signs: There were no vitals taken for this visit.  Physical Exam Vitals and nursing note reviewed.  Constitutional:      Appearance: She is well-developed and well-nourished.  Pulmonary:     Effort: Pulmonary effort is normal.  Skin:    General: Skin is warm.     Capillary Refill: Capillary refill takes less than 2 seconds.  Neurological:     Mental Status: She is alert and oriented to person, place, and time.  Psychiatric:        Mood and Affect: Mood and affect normal.        Behavior: Behavior normal.  Thought Content: Thought content normal.        Judgment: Judgment normal.     Ortho Exam Right hand shows multiple superficial puncture wounds to the volar surface.  The largest puncture wound is in the thenar compartment with exposed subcutaneous tissue.  She does have moderate swelling.  No neurovascular compromise.  There is no drainage.  Left hand shows multiple superficial puncture wounds with nail avulsion of the ring finger with exposed soft tissue and distal phalanx.  Specialty Comments:  No specialty comments available.  Imaging: DG Hand Complete Left  Result Date: 01/13/2020 CLINICAL DATA:  Dog bite injuries. EXAM: LEFT HAND - COMPLETE 3+ VIEW COMPARISON:  None. FINDINGS: There is a nondisplaced fracture involving the distal tuft of the ring finger with an overlying wound suggesting an open fracture. No other fractures are  identified. No radiopaque foreign bodies. IMPRESSION: Open fracture involving the distal tuft of the ring finger. Electronically Signed   By: Rudie Meyer M.D.   On: 01/13/2020 12:00   DG Hand Complete Right  Result Date: 01/13/2020 CLINICAL DATA:  Dog bite injury to the right hand. EXAM: RIGHT HAND - COMPLETE 3+ VIEW COMPARISON:  None. FINDINGS: The joint spaces are maintained. No acute fractures are identified. There are several areas of gas in the soft tissues likely related to patient's wounds. No definite radiopaque foreign bodies are identified. IMPRESSION: No acute bony findings or radiopaque foreign bodies. Electronically Signed   By: Rudie Meyer M.D.   On: 01/13/2020 11:59     PMFS History: Patient Active Problem List   Diagnosis Date Noted  . 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 Substance abuse in remission (HCC) 04/23/2017  . Nicotine dependence with current use 04/23/2017  . PTSD (post-traumatic stress disorder) 04/23/2017  . Encounter for birth control pills maintenance 07/11/2014  . 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 Eating disorder 07/11/2014   Past Medical History:  Diagnosis Date  . Anorexia nervosa   . Anxiety   . Depression   . Genital warts   . PTSD (post-traumatic stress disorder)   . Substance abuse (HCC)    multiple  . UTI (urinary tract infection)     Family History  Problem Relation Age of Onset  . Heart attack Mother   . Early death Mother   . Mental illness Mother   . Alcohol abuse Maternal Grandmother   . Stroke Maternal Grandmother   . Bipolar disorder Maternal Grandmother   . Alcohol abuse Maternal Grandfather   . Lung cancer Maternal Grandfather   . Alcohol abuse Paternal Grandmother   . Alcohol abuse Paternal Grandfather   . Hypertension Paternal Grandfather     Past Surgical History:  Procedure Laterality Date  . APPENDECTOMY     Social History   Occupational History  . Occupation: Server  Tobacco Use   . Smoking status: Current Every Day Smoker    Packs/day: 0.33    Years: 10.00    Pack years: 3.30  . Smokeless tobacco: Never Used  Substance and Sexual Activity  . Alcohol use: Yes    Alcohol/week: 2.0 standard drinks    Types: 2 Cans of beer per week  . Drug use: Yes    Frequency: 5.0 times per week    Types: Marijuana  . Sexual activity: Yes    Birth control/protection: Pill

## 2020-01-21 ENCOUNTER — Encounter: Payer: Self-pay | Admitting: Orthopaedic Surgery

## 2020-01-21 ENCOUNTER — Ambulatory Visit (INDEPENDENT_AMBULATORY_CARE_PROVIDER_SITE_OTHER): Payer: 59 | Admitting: Orthopaedic Surgery

## 2020-01-21 ENCOUNTER — Other Ambulatory Visit: Payer: Self-pay

## 2020-01-21 VITALS — Ht 63.0 in | Wt 130.0 lb

## 2020-01-21 DIAGNOSIS — W540XXA Bitten by dog, initial encounter: Secondary | ICD-10-CM | POA: Diagnosis not present

## 2020-01-21 DIAGNOSIS — S61452A Open bite of left hand, initial encounter: Secondary | ICD-10-CM

## 2020-01-21 NOTE — Progress Notes (Signed)
Office Visit Note   Patient: Tonya Le           Date of Birth: September 08, 1990           MRN: [id number] Visit Date: 01/21/2020              Requested by: No referring provider defined for this encounter. PCP: Patient, No Pcp Per   Assessment & Plan: Visit Diagnoses:  1. Dog bite, hand, left, initial encounter     Plan: Impression is 1 week out right hand multiple puncture wounds and left ring finger nail avulsion and distal phalanx fracture.  In regards to the right hand, he is to have pretty much scabbed over.  She will continue to watch these.  In regards to the left ring finger distal phalanx, we have applied mupirocin as well as a new AlumaFoam splint.  She will continue to apply mupirocin and wear the splint for the next 4 weeks.  She will follow up with [place] at that point in time for recheck.  Call with concerns or questions in the meantime.  Follow-Up Instructions: Return in about 4 weeks (around 02/18/2020).   Orders:  No orders of the defined types were placed in this encounter.  No orders of the defined types were placed in this encounter.     Procedures: No procedures performed   Clinical Data: No additional findings.   Subjective: Chief Complaint  Patient presents with  . Left Hand - Follow-up    DOI 01/13/2020  . Right Hand - Follow-up    DOI 01/13/2020    HPI patient is a pleasant 29 year old female who comes in today for follow-up of bilateral hand dog bite injuries.  Injury occurred on 01/13/2020 when she was breaking up a dog fight.  She sustained multiple puncture wounds to the right hand and sustained nail avulsion and distal phalanx injury to the left ring finger.  She has been using mupirocin ointment to the wounds as well as been taking Augmentin.  No fevers or chills.  She does have soreness throughout the right wrist and hand.     Objective: Vital Signs: Ht 5\' 3"  (1.6 m)   Wt 130 lb (59 kg)   BMI 23.03 kg/m     Ortho Exam right hand  exam shows multiple puncture wounds that have mostly scabbed over.  No signs of infection.  Motor and sensory function intact.  Left ring finger exam shows a well-healing nailbed.  No signs of infection.  She has slight decrease sensation to the fingertip.  Specialty Comments:  No specialty comments available.  Imaging: No new imaging   PMFS History: Patient Active Problem List   Diagnosis Date Noted  . 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 Substance abuse in remission (HCC) 04/23/2017  . Nicotine dependence with current use 04/23/2017  . PTSD (post-traumatic stress disorder) 04/23/2017  . Encounter for birth control pills maintenance 07/11/2014  . 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 Eating disorder 07/11/2014   Past Medical History:  Diagnosis Date  . Anorexia nervosa   . Anxiety   . Depression   . Genital warts   . PTSD (post-traumatic stress disorder)   . Substance abuse (HCC)    multiple  . UTI (urinary tract infection)     Family History  Problem Relation Age of Onset  . Heart attack Mother   . Early death Mother   . Mental illness Mother   . Alcohol abuse Maternal Grandmother   .  Stroke Maternal Grandmother   . Bipolar disorder Maternal Grandmother   . Alcohol abuse Maternal Grandfather   . Lung cancer Maternal Grandfather   . Alcohol abuse Paternal Grandmother   . Alcohol abuse Paternal Grandfather   . Hypertension Paternal Grandfather     Past Surgical History:  Procedure Laterality Date  . APPENDECTOMY     Social History   Occupational History  . Occupation: Server  Tobacco Use  . Smoking status: Current Every Day Smoker    Packs/day: 0.33    Years: 10.00    Pack years: 3.30  . Smokeless tobacco: Never Used  Substance and Sexual Activity  . Alcohol use: Yes    Alcohol/week: 2.0 standard drinks    Types: 2 Cans of beer per week  . Drug use: Yes    Frequency: 5.0 times per week    Types: Marijuana  . Sexual activity:  Yes    Birth control/protection: Pill

## 2020-02-22 ENCOUNTER — Ambulatory Visit: Payer: 59 | Admitting: Orthopaedic Surgery

## 2020-07-26 ENCOUNTER — Other Ambulatory Visit: Payer: Self-pay

## 2020-07-26 ENCOUNTER — Ambulatory Visit (HOSPITAL_COMMUNITY)
Admission: EM | Admit: 2020-07-26 | Discharge: 2020-07-26 | Disposition: A | Discharge status: Home / Self Care | Payer: 59

## 2020-07-26 DIAGNOSIS — F4310 Post-traumatic stress disorder, unspecified: Secondary | ICD-10-CM | POA: Diagnosis not present
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
## 2020-07-26 NOTE — Discharge Summary (Signed)
Tonya Le to be D/C'd Home per NP order. Discussed with the patient and all questions fully answered. An After Visit Summary was printed and given to the patient.  Patient escorted out and D/C home via private auto.  [name]  07/26/2020 6:58 PM

## 2020-07-26 NOTE — Discharge Instructions (Addendum)

## 2021-05-03 ENCOUNTER — Other Ambulatory Visit: Payer: Self-pay

## 2021-05-03 ENCOUNTER — Emergency Department (HOSPITAL_COMMUNITY): Payer: Self-pay

## 2021-05-03 ENCOUNTER — Emergency Department (HOSPITAL_COMMUNITY)
Admission: EM | Admit: 2021-05-03 | Discharge: 2021-05-03 | Disposition: A | Discharge status: Home / Self Care | Payer: Self-pay | Attending: Emergency Medicine | Admitting: Emergency Medicine

## 2021-05-03 DIAGNOSIS — N201 Calculus of ureter: Secondary | ICD-10-CM

## 2021-05-03 DIAGNOSIS — N132 Hydronephrosis with renal and ureteral calculous obstruction: Secondary | ICD-10-CM | POA: Insufficient documentation

## 2021-05-03 DIAGNOSIS — D72829 Elevated white blood cell count, unspecified: Secondary | ICD-10-CM | POA: Insufficient documentation

## 2021-05-03 DIAGNOSIS — E876 Hypokalemia: Secondary | ICD-10-CM | POA: Insufficient documentation

## 2021-05-03 LAB — CBC
HCT: 43.6 % (ref 36.0–46.0)
Hemoglobin: 14.7 g/dL (ref 12.0–15.0)
MCH: 32.2 pg (ref 26.0–34.0)
MCHC: 33.7 g/dL (ref 30.0–36.0)
MCV: 95.6 fL (ref 80.0–100.0)
Platelets: 201 10*3/uL (ref 150–400)
RBC: 4.56 MIL/uL (ref 3.87–5.11)
RDW: 12.8 % (ref 11.5–15.5)
WBC: 13.3 10*3/uL — ABNORMAL HIGH (ref 4.0–10.5)
nRBC: 0 % (ref 0.0–0.2)

## 2021-05-03 LAB — URINALYSIS, ROUTINE W REFLEX MICROSCOPIC
Bilirubin Urine: NEGATIVE
Glucose, UA: 50 mg/dL — AB
Ketones, ur: NEGATIVE mg/dL
Leukocytes,Ua: NEGATIVE
Nitrite: NEGATIVE
Protein, ur: NEGATIVE mg/dL
RBC / HPF: >50 RBC/hpf — ABNORMAL HIGH (ref 0–5)
Specific Gravity, Urine: 1.008 (ref 1.005–1.030)
pH: 9 — ABNORMAL HIGH (ref 5.0–8.0)

## 2021-05-03 LAB — COMPREHENSIVE METABOLIC PANEL
ALT: 16 U/L (ref 0–44)
AST: 25 U/L (ref 15–41)
Albumin: 4.2 g/dL (ref 3.5–5.0)
Alkaline Phosphatase: 78 U/L (ref 38–126)
Anion gap: 9 (ref 5–15)
BUN: 8 mg/dL (ref 6–20)
CO2: 25 mmol/L (ref 22–32)
Calcium: 9.6 mg/dL (ref 8.9–10.3)
Chloride: 104 mmol/L (ref 98–111)
Creatinine, Ser: 0.72 mg/dL (ref 0.44–1.00)
GFR, Estimated: >60 mL/min (ref >60)
Glucose, Bld: 145 mg/dL — ABNORMAL HIGH (ref 70–99)
Potassium: 3.2 mmol/L — ABNORMAL LOW (ref 3.5–5.1)
Sodium: 138 mmol/L (ref 135–145)
Total Bilirubin: 0.4 mg/dL (ref 0.3–1.2)
Total Protein: 7 g/dL (ref 6.5–8.1)

## 2021-05-03 LAB — I-STAT BETA HCG BLOOD, ED (MC, WL, AP ONLY): I-stat hCG, quantitative: <5 m[IU]/mL (ref <5)

## 2021-05-03 LAB — RAPID URINE DRUG SCREEN, HOSP PERFORMED
Amphetamines: NOT DETECTED
Barbiturates: NOT DETECTED
Benzodiazepines: NOT DETECTED
Cocaine: POSITIVE — AB
Opiates: NOT DETECTED
Tetrahydrocannabinol: POSITIVE — AB

## 2021-05-03 LAB — LIPASE, BLOOD: Lipase: 63 U/L — ABNORMAL HIGH (ref 11–51)

## 2021-05-03 MED ORDER — SODIUM CHLORIDE 0.9 % IV BOLUS
1000.0000 mL | Freq: Once | INTRAVENOUS | Status: AC
  Administered 2021-05-03: 1000 mL via INTRAVENOUS

## 2021-05-03 MED ORDER — ONDANSETRON HCL 4 MG/2ML IJ SOLN
4.0000 mg | Freq: Once | INTRAMUSCULAR | Status: AC
  Administered 2021-05-03: 4 mg via INTRAVENOUS
  Filled 2021-05-03: qty 2

## 2021-05-03 MED ORDER — TAMSULOSIN HCL 0.4 MG PO CAPS: 0.4000 mg | ORAL_CAPSULE | Freq: Every day | ORAL | 0 refills | Status: DC

## 2021-05-03 MED ORDER — ONDANSETRON 4 MG PO TBDP
4.0000 mg | ORAL_TABLET | Freq: Once | ORAL | Status: AC
  Administered 2021-05-03: 4 mg via ORAL
  Filled 2021-05-03: qty 1

## 2021-05-03 MED ORDER — MORPHINE SULFATE (PF) 4 MG/ML IV SOLN
4.0000 mg | Freq: Once | INTRAVENOUS | Status: AC
  Administered 2021-05-03: 4 mg via INTRAVENOUS
  Filled 2021-05-03: qty 1

## 2021-05-03 MED ORDER — OXYCODONE HCL 5 MG PO TABS: 5.0000 mg | ORAL_TABLET | ORAL | 0 refills | Status: DC | PRN

## 2021-05-03 MED ORDER — IOHEXOL 300 MG/ML  SOLN
100.0000 mL | Freq: Once | INTRAMUSCULAR | Status: AC | PRN
  Administered 2021-05-03: 100 mL via INTRAVENOUS

## 2021-05-03 MED ORDER — OXYCODONE-ACETAMINOPHEN 5-325 MG PO TABS
1.0000 | ORAL_TABLET | Freq: Once | ORAL | Status: AC
  Administered 2021-05-03: 1 via ORAL
  Filled 2021-05-03: qty 1

## 2021-05-03 NOTE — ED Provider Notes (Signed)
?[facility] ?Provider Note ? ? ?CSN: [id number] ?Arrival date & time: 05/03/21  1259 ? ?  ? ?History ? ?Chief Complaint  ?Patient presents with  ? Abdominal Pain  ? ? ?Tonya Le is a 31 y.o. female who presents with concern for left lower abdominal pain that rates left flank since 8:00 this morning with associated nausea and 2 episodes of NBNB emesis.  No diarrhea.  No fevers but some mild chills.  Patient denies any changes in her urinary habits.  Denies dysuria, hematuria, urinary frequency or urgency. ? ?She denies any chest pain or trouble breathing.  Denies any known sick contacts.  I have personally reviewed this patient's medical records.  She is history of polysubstance abuse and endorses using cocaine last night.  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  She states she is on lithium, fluoxetine, and Lamictal daily. ? ?HPI ? ?  ? ?Home Medications ?Prior to Admission medications   ?Medication Sig Start Date End Date Taking? Authorizing Provider  ?amoxicillin-clavulanate (AUGMENTIN) 875-125 MG tablet Take 1 tablet by mouth 2 (two) times daily. One po bid x 7 days 01/13/20   Fayrene Helperran, Bowie, PA-C  ?amoxicillin-clavulanate (AUGMENTIN) 875-125 MG tablet Take 1 tablet by mouth 2 (two) times daily. 01/14/20   Tarry KosXu, Naiping M, MD  ?fluvoxaMINE (LUVOX) 100 MG tablet Take 300 mg by mouth at bedtime.     [provider]  ?gabapentin (NEURONTIN) 300 MG capsule Take 300 mg by mouth at bedtime as needed (For insomnia.).  09/17/18   [provider]  ?ibuprofen (ADVIL) 600 MG tablet Take 1 tablet (600 mg total) by mouth every 6 (six) hours as needed. 01/13/20   Fayrene Helperran, Bowie, PA-C  ?lamoTRIgine (LAMICTAL) 200 MG tablet Take 200 mg by mouth at bedtime.  09/14/18   [provider]  ?lithium carbonate (LITHOBID) 300 MG CR tablet Take 900 mg by mouth at bedtime. 10/05/18   [provider]  ?mupirocin ointment (BACTROBAN) 2 % Apply 1 application  topically 2 (two) times daily. 01/14/20   Tarry KosXu, Naiping M, MD  ?propranolol (INDERAL) 40 MG tablet Take 80 mg by mouth at bedtime.    [provider]  ?traZODone (DESYREL) 50 MG tablet Take 50-100 mg by mouth at bedtime. 11/13/18   [provider]  ?   ? ?Allergies    ?Patient has no known allergies.   ? ?Review of Systems   ?Review of Systems  ?Constitutional:  Positive for activity change, appetite change, chills, fatigue and fever.  ?HENT: Negative.    ?Respiratory: Negative.    ?Gastrointestinal:  Positive for abdominal pain, nausea and vomiting. Negative for diarrhea.  ?Genitourinary: Negative.   ?Musculoskeletal: Negative.   ?Skin: Negative.   ?Neurological: Negative.   ? ?Physical Exam ?Updated Vital Signs ?BP (!) 151/115   Pulse 60   Temp (!) 97.4 ?F (36.3 ?C) (Oral)   Resp 16   SpO2 100%  ?Physical Exam ?Vitals and nursing note reviewed.  ?Constitutional:   ?   Appearance: She is ill-appearing. She is not toxic-appearing.  ?HENT:  ?   Head: Normocephalic and atraumatic.  ?   Nose: Nose normal.  ?   Mouth/Throat:  ?   Mouth: Mucous membranes are moist.  ?   Pharynx: Oropharynx is clear. Uvula midline. No oropharyngeal exudate or posterior oropharyngeal erythema.  ?   Tonsils: No tonsillar exudate.  ?Eyes:  ?   General: Lids are normal. Vision grossly intact.     ?  Right eye: No discharge.     ?   Left eye: No discharge.  ?   Extraocular Movements: Extraocular movements intact.  ?   Conjunctiva/sclera: Conjunctivae normal.  ?   Pupils: Pupils are equal, round, and reactive to light.  ?Neck:  ?   Trachea: Trachea and phonation normal.  ?Cardiovascular:  ?   Rate and Rhythm: Normal rate and regular rhythm.  ?   Pulses: Normal pulses.  ?   Heart sounds: Normal heart sounds. No murmur heard. ?   Comments: No murmur today ?Pulmonary:  ?   Effort: Pulmonary effort is normal. No tachypnea, bradypnea, accessory muscle usage, prolonged expiration or respiratory distress.  ?   Breath sounds: Normal  breath sounds. No wheezing or rales.  ?Chest:  ?   Chest wall: No mass, lacerations, deformity, swelling, tenderness, crepitus or edema.  ?Abdominal:  ?   General: Bowel sounds are normal. There is no distension.  ?   Palpations: Abdomen is soft.  ?   Tenderness: There is generalized abdominal tenderness and tenderness in the periumbilical area and left lower quadrant. There is no right CVA tenderness, left CVA tenderness, guarding or rebound.  ?Musculoskeletal:     ?   General: No deformity.  ?   Cervical back: Normal range of motion and neck supple.  ?   Right lower leg: No edema.  ?   Left lower leg: No edema.  ?Lymphadenopathy:  ?   Cervical: No cervical adenopathy.  ?Skin: ?   General: Skin is warm and dry.  ?   Capillary Refill: Capillary refill takes less than 2 seconds.  ?Neurological:  ?   Mental Status: She is alert. Mental status is at baseline.  ?Psychiatric:     ?   Mood and Affect: Mood normal.  ? ? ?ED Results / Procedures / Treatments   ?Labs ?(all labs ordered are listed, but only abnormal results are displayed) ?Labs Reviewed  ?LIPASE, BLOOD - Abnormal; Notable for the following components:  ?    Result Value  ? Lipase 63 (*)   ? All other components within normal limits  ?COMPREHENSIVE METABOLIC PANEL - Abnormal; Notable for the following components:  ? Potassium 3.2 (*)   ? Glucose, Bld 145 (*)   ? All other components within normal limits  ?CBC - Abnormal; Notable for the following components:  ? WBC 13.3 (*)   ? All other components within normal limits  ?URINALYSIS, ROUTINE W REFLEX MICROSCOPIC - Abnormal; Notable for the following components:  ? Color, Urine STRAW (*)   ? pH 9.0 (*)   ? Glucose, UA 50 (*)   ? Hgb urine dipstick LARGE (*)   ? RBC / HPF >50 (*)   ? Bacteria, UA RARE (*)   ? All other components within normal limits  ?RAPID URINE DRUG SCREEN, HOSP PERFORMED - Abnormal; Notable for the following components:  ? Cocaine POSITIVE (*)   ? Tetrahydrocannabinol POSITIVE (*)   ? All  other components within normal limits  ?I-STAT BETA HCG BLOOD, ED ([facility], [facility], [facility] ONLY)  ? ? ?EKG ?None ? ?Radiology ?US PELVIC COMPLETE W TRANSVAGINAL AND TORSION R/O ? ?Result Date: 05/03/2021 ?CLINICAL DATA:  Acute left lower quadrant abdominal pain. EXAM: TRANSABDOMINAL AND TRANSVAGINAL ULTRASOUND OF PELVIS DOPPLER ULTRASOUND OF OVARIES TECHNIQUE: Both transabdominal and transvaginal ultrasound examinations of the pelvis were performed. Transabdominal technique was performed for global imaging of the pelvis including uterus, ovaries, adnexal regions, and pelvic cul-de-sac. It was  necessary to proceed with endovaginal exam following the transabdominal exam to visualize the ovaries. Color and duplex Doppler ultrasound was utilized to evaluate blood flow to the ovaries. COMPARISON:  None. FINDINGS: Uterus Measurements: 6.8 x 3.7 x 3.1 cm = volume: 40 mL. No fibroids or other mass visualized. Endometrium Thickness: 5 mm which is within normal limits. No focal abnormality visualized. Right ovary Measurements: 3.1 x 2.3 x 2.3 cm = volume: 7 mL. Normal appearance/no adnexal mass. Left ovary Measurements: 2.7 x 2.7 x 1.6 cm = volume: 6 mL. Normal appearance/no adnexal mass. Pulsed Doppler evaluation of both ovaries demonstrates normal low-resistance arterial and venous waveforms. Other findings No abnormal free fluid. IMPRESSION: No evidence of ovarian mass or torsion. No definite abnormality seen in the pelvis. Electronically Signed   By: Lupita Raider M.D.   On: 05/03/2021 14:15   ? ?Procedures ?Procedures  ? ? ?Medications Ordered in ED ?Medications  ?oxyCODONE-acetaminophen (PERCOCET/ROXICET) 5-325 MG per tablet 1 tablet (1 tablet Oral Given 05/03/21 1319)  ?ondansetron (ZOFRAN-ODT) disintegrating tablet 4 mg (4 mg Oral Given 05/03/21 1319)  ? ? ?ED Course/ Medical Decision Making/ A&P ?  ?                        ?Medical Decision Making ?31 year old female who presents with concern for left lower abdominal pain since  this morning.  History of polysubstance abuse. ? ?Hypertensive on intake, vital signs otherwise normal.  Cardiopulmonary exam is normal, abdominal exam as above with tenderness palpation in the left lower quad

## 2021-05-03 NOTE — Discharge Instructions (Signed)
You were seen in the ER today for your abdominal pain.  You have a kidney stone on the left side.  You been prescribed medication to take daily called Flomax to help encourage your kidneys to flush the stone out.  Additionally been prescribed a pain medication called oxycodone which may take once every 4-6 hours for severe pain.  You may try Tylenol prior to this for pain.  Please increase your hydration with plenty of water.  You may follow-up with the urologist listed below or can return to the ER for any new severe symptoms.  Return to the ER if you develop any fevers, chills, nausea or vomiting that does not stop, or any other new severe symptom. ?

## 2021-05-03 NOTE — ED Provider Triage Note (Signed)
Emergency Medicine Provider Triage Evaluation Note ? ?Tonya Le , a 31 y.o. female  was evaluated in triage.  Pt complains of abdominal pain since this morning. Pt localizes pain to LLQ. She thought it would go away after a bowel movement but it has persisted and worsened. 1 episode of emesis. Hx of appendectomy, no other abdominal surgeries.  ? ?Also reports she has not taken her psych meds in several days and doesn't know if this made her sx worse.  ? ?Review of Systems  ?Positive: Abdominal pain, vomiting ?Negative: Urinary sx, diarrhea ? ?Physical Exam  ?BP (!) 158/134   Pulse 69   Temp (!) 97.4 ?F (36.3 ?C) (Oral)   Resp 15   SpO2 100%  ?Gen:   Awake, no distress   ?Resp:  Normal effort  ?MSK:   Moves extremities without difficulty  ?Other:  Patient writhing in pain in triage ? ?Medical Decision Making  ?Medically screening exam initiated at 1:14 PM.  Appropriate orders placed.  [name] was informed that the remainder of the evaluation will be completed by another provider, this initial triage assessment does not replace that evaluation, and the importance of remaining in the ED until their evaluation is complete. ? ?Pt localizes pain in LLQ with some radiation into her groin, will obtain labs and ultrasound to r/o ovarian torsion.  ?  Kateri Plummer, PA-C ?05/03/21 1315 ? ?

## 2021-05-03 NOTE — ED Triage Notes (Signed)
Pt with LLQ pain with radiation to L flank since this morning. Emesis x 1.  ?

## 2022-02-16 IMAGING — CR DG HAND COMPLETE 3+V*L*
3 series · 3 of 3 positions shown · non-contrast
Comparison: None.

CLINICAL DATA: Dog bite injuries.

EXAM:
LEFT HAND - COMPLETE 3+ VIEW

[hand pa]
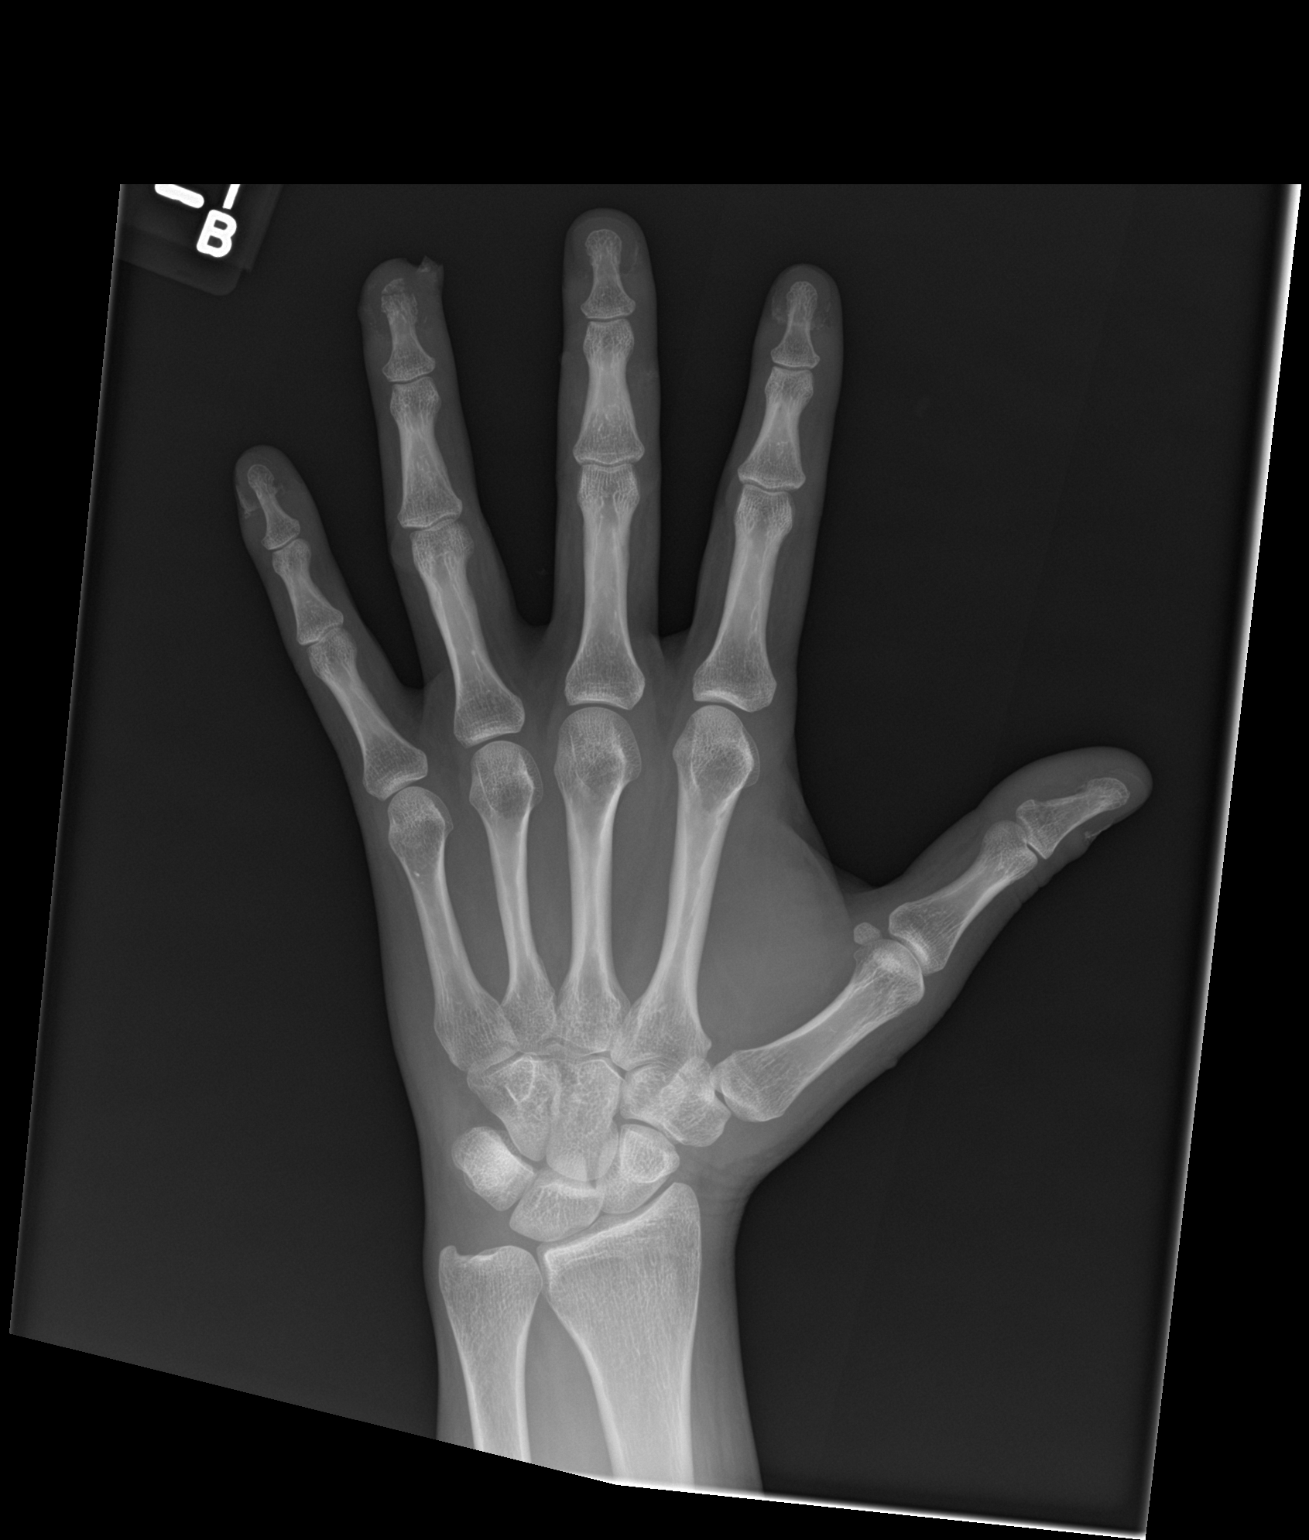

[hand obl]
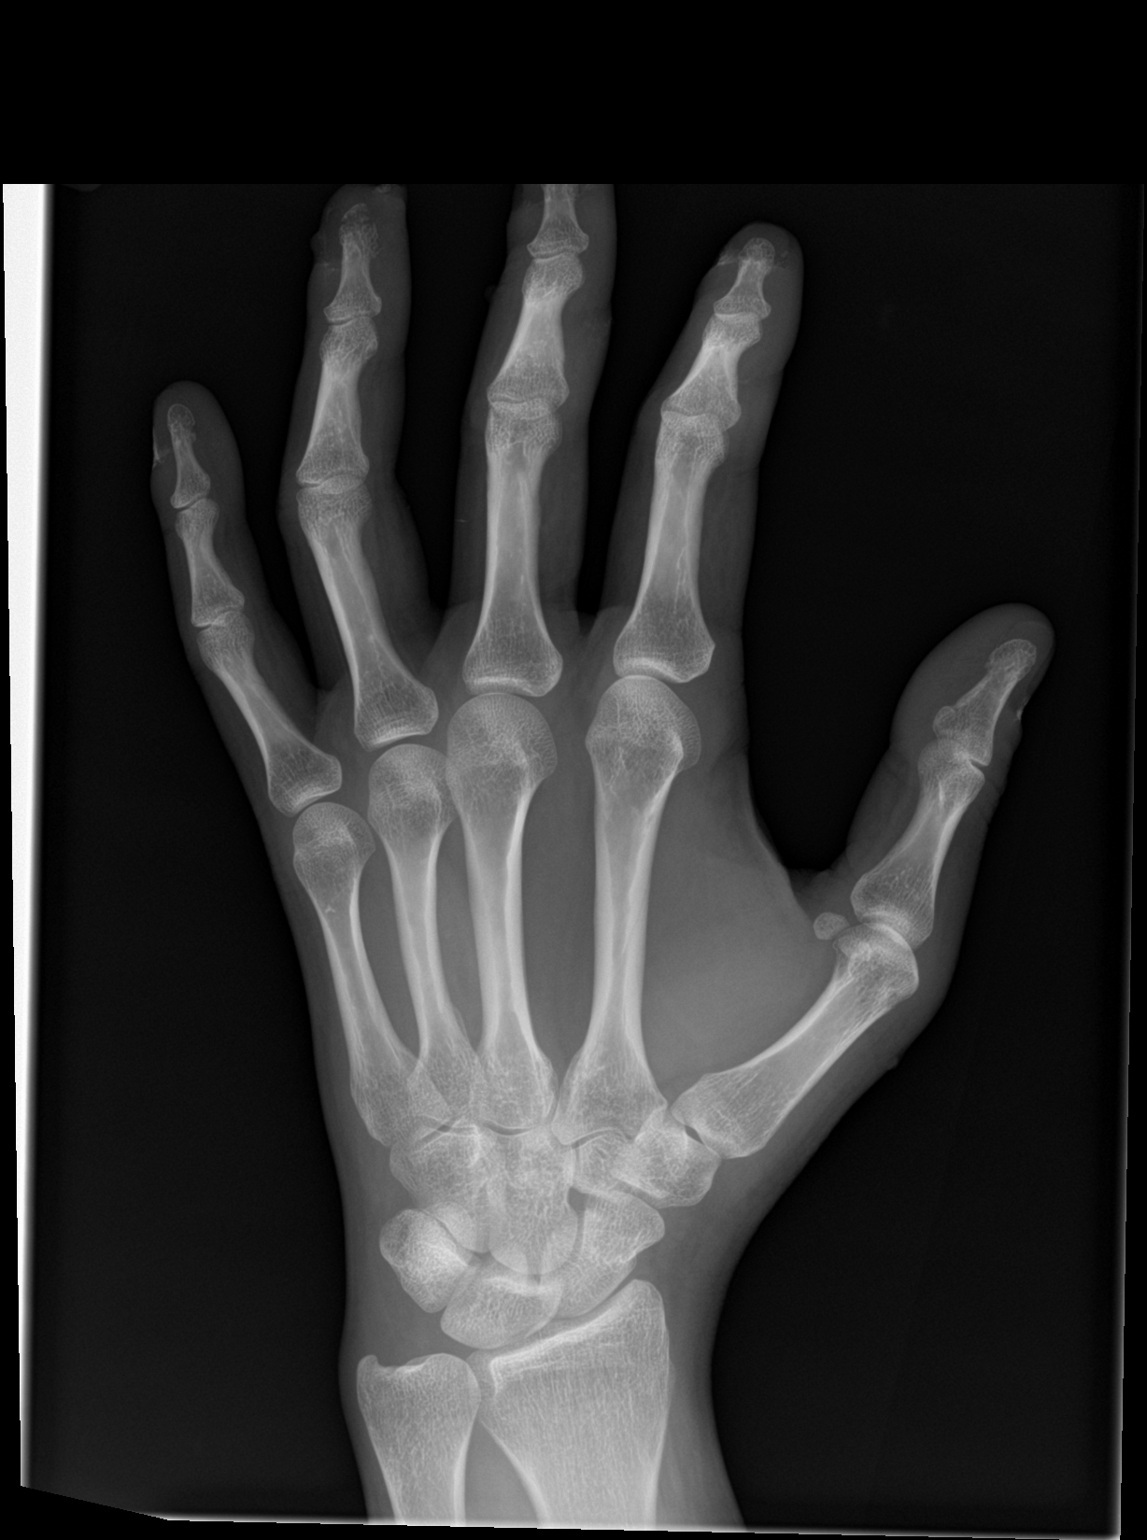

[hand lat]
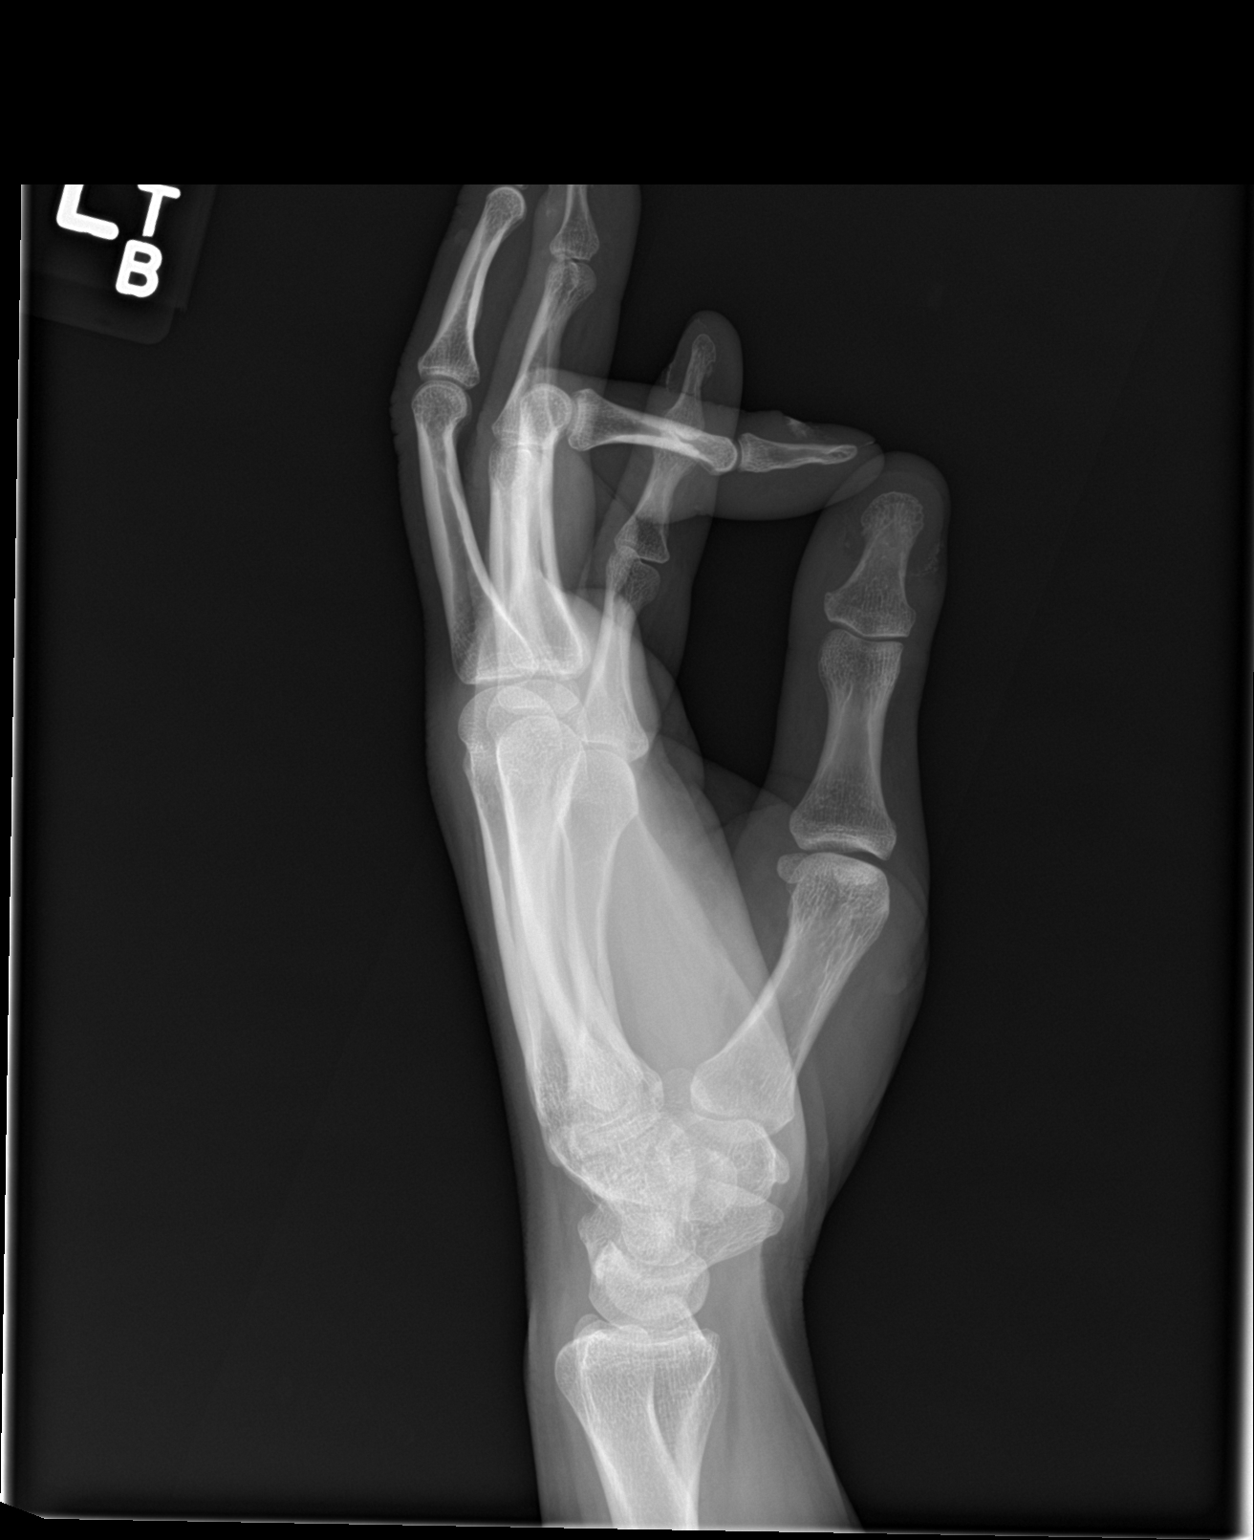

[3 of 3 positions shown; findings below may reference images not displayed]

FINDINGS: There is a nondisplaced fracture involving the distal tuft of the
ring finger with an overlying wound suggesting an open fracture. No
other fractures are identified. No radiopaque foreign bodies.
IMPRESSION: Open fracture involving the distal tuft of the ring finger.

## 2023-06-07 IMAGING — CT CT ABD-PELV W/ CM
2 of 4 series · 16 of 46 positions shown, 18 images · IV contrast (Omni 300)
Comparison: 05/23/2010

CLINICAL DATA: Left lower quadrant and left flank pain since this
morning.

EXAM:
CT ABDOMEN AND PELVIS WITH CONTRAST
TECHNIQUE: Multidetector CT imaging of the abdomen and pelvis was performed
using the standard protocol following bolus administration of
intravenous contrast.

[Series 3: a/p w/ 5mm · axial · 0.80mm/px · z∈[-342,+88]mm · 13 of 94 slices shown, 15 images]
[im 4/94  soft-tissue]
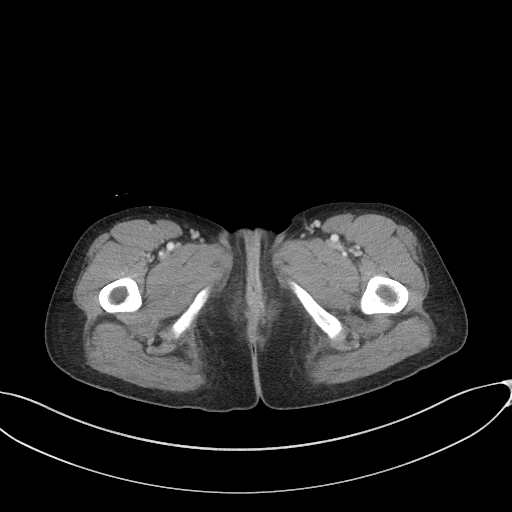
[im 4/94  bone]
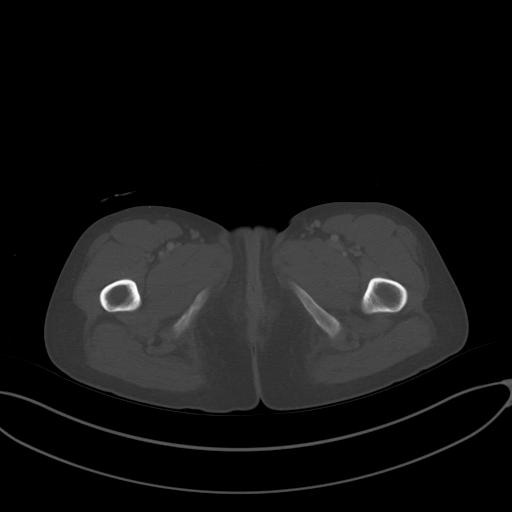
[im 12/94  soft-tissue]
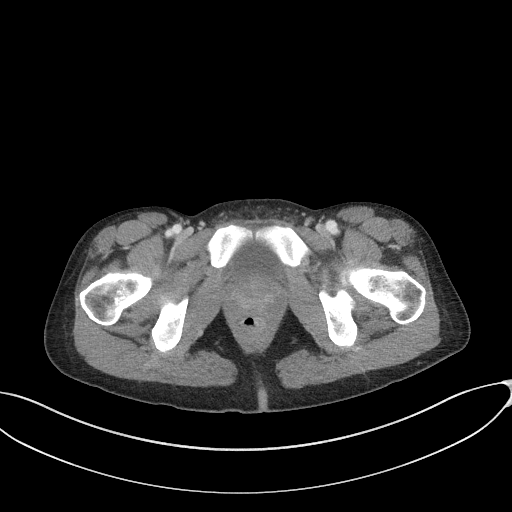
[im 20/94  soft-tissue]
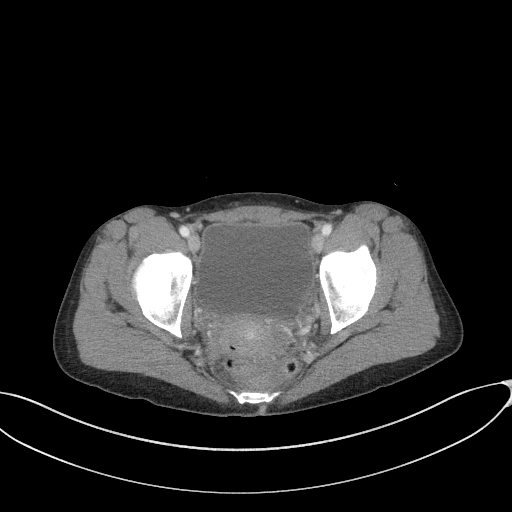
[im 28/94  soft-tissue]
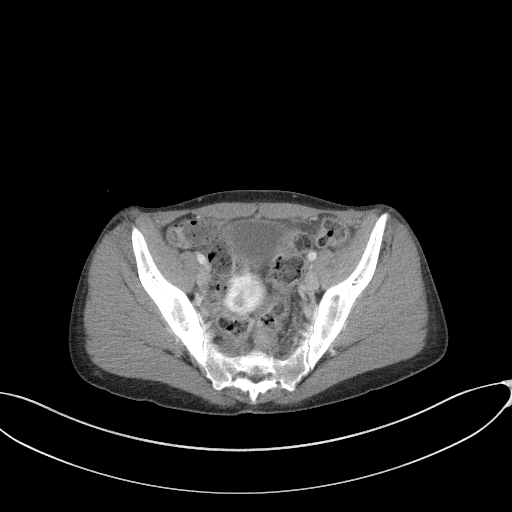
[im 32/94  soft-tissue]
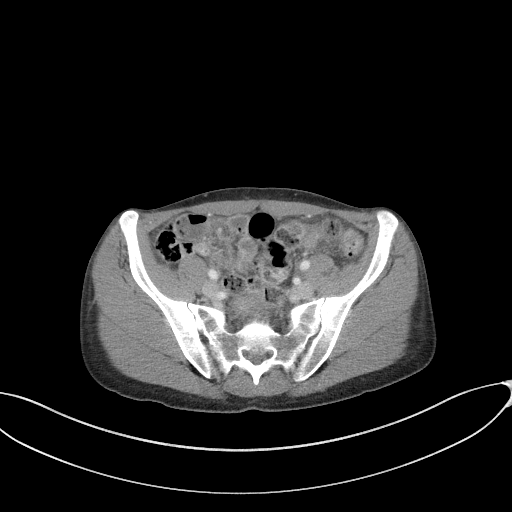
[im 39/94  soft-tissue]
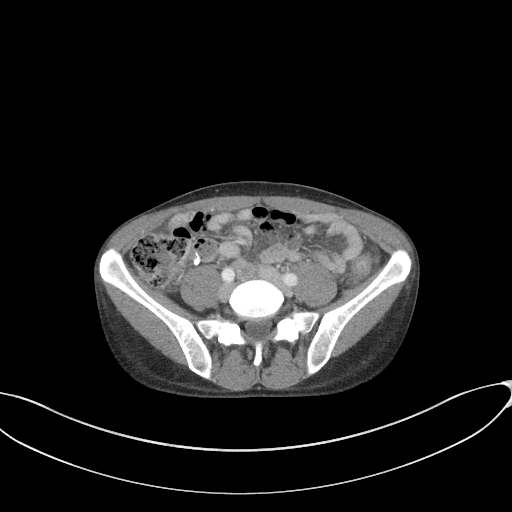
[im 47/94  soft-tissue]
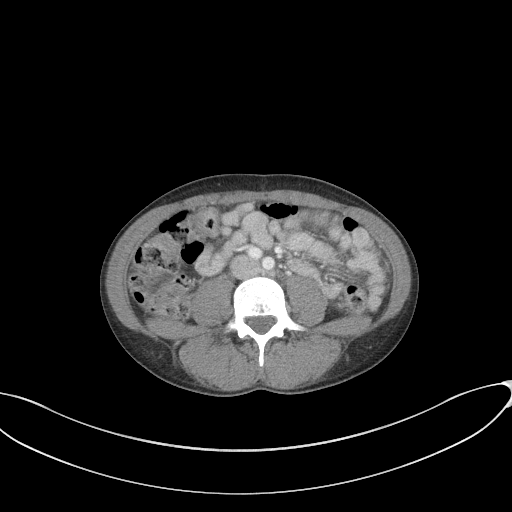
[im 55/94  soft-tissue]
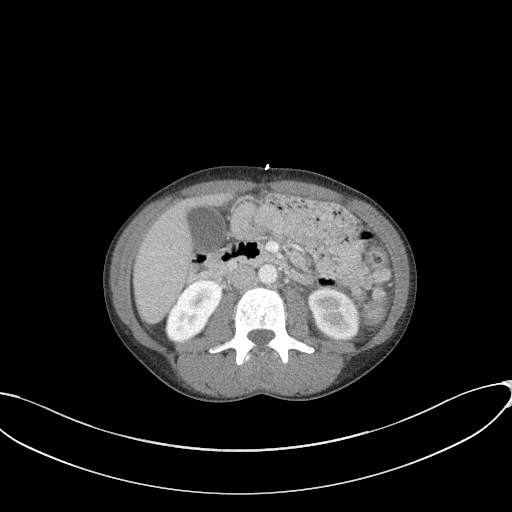
[im 63/94  soft-tissue]
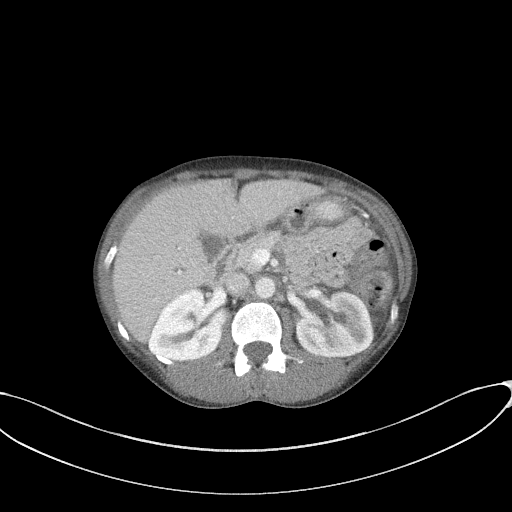
[im 63/94  bone]
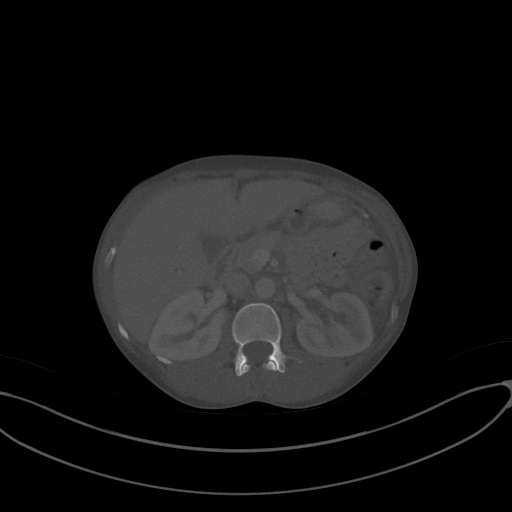
[im 66/94  soft-tissue]
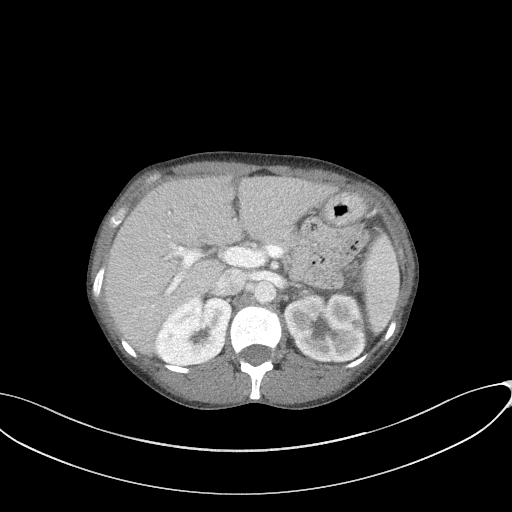
[im 74/94  soft-tissue]
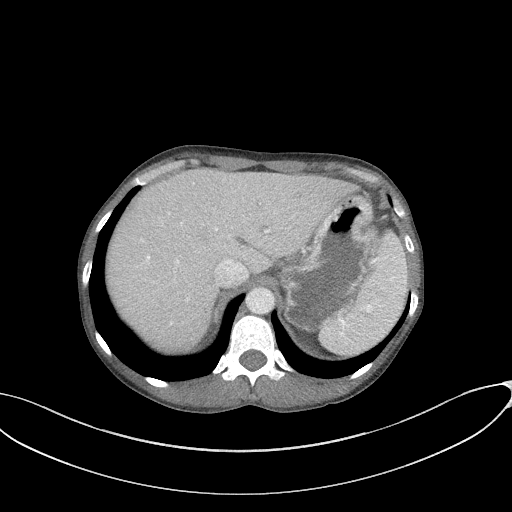
[im 82/94  soft-tissue]
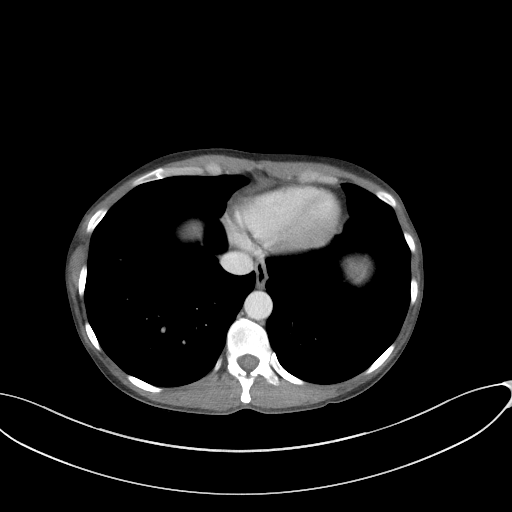
[im 90/94  soft-tissue]
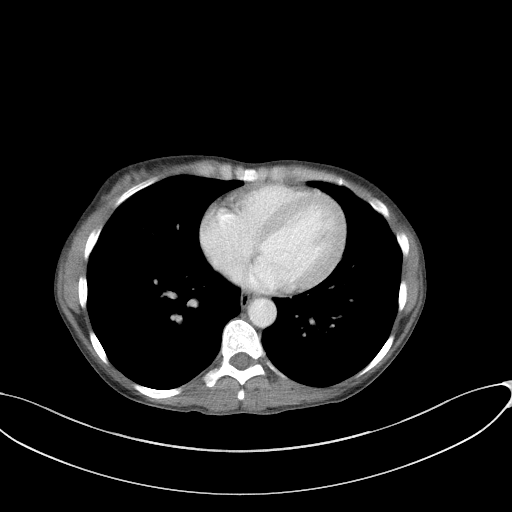

[Series 6: a/p w/ cor · coronal · 0.73mm/px · 3 of 131 slices shown]
[im 44/131  soft-tissue]
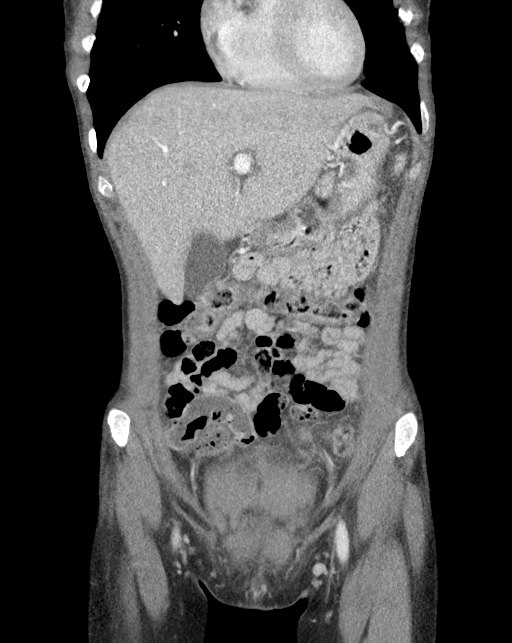
[im 58/131  soft-tissue]
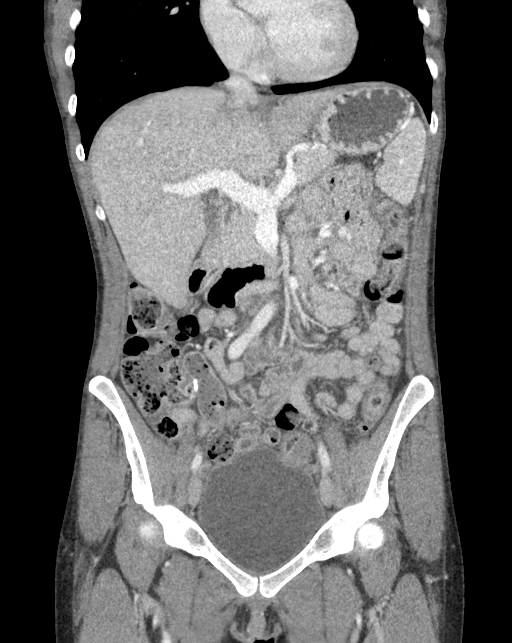
[im 73/131  soft-tissue]
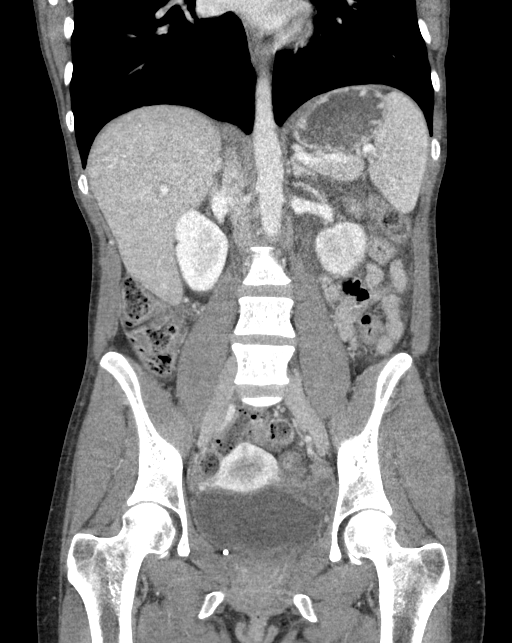

[16 of 46 positions shown; findings below may reference images not displayed]

RADIATION DOSE REDUCTION: This exam was performed according to the
departmental dose-optimization program which includes automated
exposure control, adjustment of the mA and/or kV according to
patient size and/or use of iterative reconstruction technique.

CONTRAST:  100mL OMNIPAQUE IOHEXOL 300 MG/ML  SOLN
FINDINGS: Lower chest: The lung bases are clear of acute process. No pleural
effusion or pulmonary lesions. The heart is normal in size. No
pericardial effusion. The distal esophagus and aorta are
unremarkable.

Hepatobiliary: No hepatic lesions or intrahepatic biliary
dilatation. The gallbladder is unremarkable. No common bile duct
dilatation.

Pancreas: No mass, inflammation or ductal dilatation.

Spleen: Normal size.  No focal lesions.

Adrenals/Urinary Tract: Adrenal glands are unremarkable.

The left kidney demonstrates decreased perfusion compared to the
right kidney and there is mild left-sided hydroureteronephrosis down
to an obstructing 5 mm calculus located at the L3-4 disc space
level. No renal calculi. No bladder calculi.

Stomach/Bowel: The stomach, duodenum, small bowel and colon are
grossly normal without oral contrast. No inflammatory changes, mass
lesions or obstructive findings. Status post appendectomy.

Vascular/Lymphatic: The aorta is normal in caliber. No dissection.
The branch vessels are patent. The major venous structures are
patent. No mesenteric or retroperitoneal mass or adenopathy. Small
scattered lymph nodes are noted.

Reproductive: The uterus and ovaries are unremarkable.

Other: No pelvic mass or adenopathy. No free pelvic fluid
collections. No inguinal mass or adenopathy. No abdominal wall
hernia or subcutaneous lesions.

Musculoskeletal: No significant bony findings.
IMPRESSION: 1. 5 mm left ureteral calculus causing mild left-sided
hydroureteronephrosis and likely accounting for the patient's left
flank pain.
2. No renal calculi.
3. No other significant abdominal/pelvic findings and no mass
lesions or adenopathy.

## 2023-11-20 ENCOUNTER — Ambulatory Visit: Payer: Self-pay | Admitting: Internal Medicine

## 2023-11-20 ENCOUNTER — Encounter: Payer: Self-pay | Admitting: Internal Medicine

## 2023-11-20 VITALS — BP 120/80 | HR 92 | Resp 24 | Ht 63.75 in | Wt 139.0 lb

## 2023-11-20 DIAGNOSIS — F319 Bipolar disorder, unspecified: Secondary | ICD-10-CM | POA: Insufficient documentation

## 2023-11-20 DIAGNOSIS — F1090 Alcohol use, unspecified, uncomplicated: Secondary | ICD-10-CM | POA: Insufficient documentation

## 2023-11-20 DIAGNOSIS — Z23 Encounter for immunization: Secondary | ICD-10-CM

## 2023-11-20 DIAGNOSIS — F4310 Post-traumatic stress disorder, unspecified: Secondary | ICD-10-CM
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
## 2023-11-27 ENCOUNTER — Ambulatory Visit: Payer: Self-pay | Admitting: Psychology

## 2023-11-27 DIAGNOSIS — F411 Generalized anxiety disorder: Secondary | ICD-10-CM

## 2023-11-27 DIAGNOSIS — F1090 Alcohol use, unspecified, uncomplicated: Secondary | ICD-10-CM

## 2023-11-27 DIAGNOSIS — F4310 Post-traumatic stress disorder, unspecified: Secondary | ICD-10-CM

## 2023-11-27 DIAGNOSIS — F332 Major depressive disorder, recurrent severe without psychotic features: Secondary | ICD-10-CM
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
## 2023-11-28 NOTE — Progress Notes (Unsigned)
 Patient was seen by Charmaine and screened for depression, anxiety, and SDOH. Patient made an appointment to begin counseling services for next week.      11/21/2023    1:26 PM 04/23/2017    9:34 AM  Depression screen PHQ 2/9  Decreased Interest 3 0  Down, Depressed, Hopeless 3 1  PHQ - 2 Score 6 1  Altered sleeping 3 1  Tired, decreased energy 3 0  Change in appetite 3 0  Feeling bad or failure about yourself  3 1  Trouble concentrating 1 0  Moving slowly or fidgety/restless 3 0  Suicidal thoughts 3 0  PHQ-9 Score 25 3  Difficult doing work/chores Extremely dIfficult Somewhat difficult       11/21/2023    1:28 PM 11/21/2023    1:27 PM  GAD 7 : Generalized Anxiety Score  Nervous, Anxious, on Edge 3 3  Control/stop worrying 3 3  Worry too much - different things 3 3  Trouble relaxing 3 3  Restless 3 3  Easily annoyed or irritable 3   Afraid - awful might happen 3 3  Total GAD 7 Score 21   Anxiety Difficulty Extremely difficult Extremely difficult     SDOH Screenings   Food Insecurity: Food Insecurity Present (11/27/2023)  Housing: Unknown (11/27/2023)  Transportation Needs: No Transportation Needs (11/21/2023)  Utilities: Not At Risk (11/21/2023)  Depression (PHQ2-9): High Risk (11/21/2023)  Social Connections: Unknown (06/19/2021)   Received from [facility]  Tobacco Use: High Risk (11/20/2023)

## 2023-12-03 ENCOUNTER — Ambulatory Visit: Payer: Self-pay | Admitting: Psychology

## 2023-12-03 DIAGNOSIS — F411 Generalized anxiety disorder: Secondary | ICD-10-CM

## 2023-12-03 DIAGNOSIS — F4310 Post-traumatic stress disorder, unspecified: Secondary | ICD-10-CM

## 2023-12-03 DIAGNOSIS — F1090 Alcohol use, unspecified, uncomplicated: Secondary | ICD-10-CM

## 2023-12-03 DIAGNOSIS — F332 Major depressive disorder, recurrent severe without psychotic features: Secondary | ICD-10-CM

## 2023-12-05 ENCOUNTER — Other Ambulatory Visit (INDEPENDENT_AMBULATORY_CARE_PROVIDER_SITE_OTHER): Payer: Self-pay

## 2023-12-05 DIAGNOSIS — Z7689 Persons encountering health services in other specified circumstances: Secondary | ICD-10-CM

## 2023-12-06 ENCOUNTER — Ambulatory Visit: Payer: Self-pay | Admitting: Internal Medicine

## 2023-12-06 LAB — COMPREHENSIVE METABOLIC PANEL WITH GFR
ALT: 20 IU/L (ref 0–32)
AST: 21 IU/L (ref 0–40)
Albumin: 4.6 g/dL (ref 3.9–4.9)
Alkaline Phosphatase: 100 IU/L (ref 41–116)
BUN/Creatinine Ratio: 15 (ref 9–23)
BUN: 13 mg/dL (ref 6–20)
Bilirubin Total: <0.2 mg/dL (ref 0.0–1.2)
CO2: 24 mmol/L (ref 20–29)
Calcium: 9.5 mg/dL (ref 8.7–10.2)
Chloride: 102 mmol/L (ref 96–106)
Creatinine, Ser: 0.84 mg/dL (ref 0.57–1.00)
Globulin, Total: 2.3 g/dL (ref 1.5–4.5)
Glucose: 87 mg/dL (ref 70–99)
Potassium: 3.8 mmol/L (ref 3.5–5.2)
Sodium: 138 mmol/L (ref 134–144)
Total Protein: 6.9 g/dL (ref 6.0–8.5)
eGFR: 93 mL/min/1.73 (ref >59)

## 2023-12-06 LAB — CBC WITH DIFFERENTIAL/PLATELET
Basophils Absolute: 0.1 x10E3/uL (ref 0.0–0.2)
Basos: 1 %
EOS (ABSOLUTE): 0 x10E3/uL (ref 0.0–0.4)
Eos: 0 %
Hematocrit: 45.1 % (ref 34.0–46.6)
Hemoglobin: 15.1 g/dL (ref 11.1–15.9)
Immature Grans (Abs): 0.1 x10E3/uL (ref 0.0–0.1)
Immature Granulocytes: 1 %
Lymphocytes Absolute: 2.5 x10E3/uL (ref 0.7–3.1)
Lymphs: 28 %
MCH: 33.4 pg — ABNORMAL HIGH (ref 26.6–33.0)
MCHC: 33.5 g/dL (ref 31.5–35.7)
MCV: 100 fL — ABNORMAL HIGH (ref 79–97)
Monocytes Absolute: 0.6 x10E3/uL (ref 0.1–0.9)
Monocytes: 7 %
Neutrophils Absolute: 5.7 x10E3/uL (ref 1.4–7.0)
Neutrophils: 63 %
Platelets: 222 x10E3/uL (ref 150–450)
RBC: 4.52 x10E6/uL (ref 3.77–5.28)
RDW: 12.8 % (ref 11.7–15.4)
WBC: 9 x10E3/uL (ref 3.4–10.8)

## 2023-12-06 LAB — LIPID PANEL
Chol/HDL Ratio: 2.5 ratio (ref 0.0–4.4)
Cholesterol, Total: 180 mg/dL (ref 100–199)
HDL: 73 mg/dL (ref >39)
LDL Chol Calc (NIH): 86 mg/dL (ref 0–99)
Triglycerides: 121 mg/dL (ref 0–149)
VLDL Cholesterol Cal: 21 mg/dL (ref 5–40)

## 2023-12-06 LAB — TSH: TSH: 0.928 u[IU]/mL (ref 0.450–4.500)

## 2023-12-06 MED ORDER — THIAMINE HCL 100 MG PO TABS
100.0000 mg | ORAL_TABLET | Freq: Every day | ORAL | Status: AC
Start: 2023-12-06 — End: ?

## 2023-12-06 MED ORDER — VITAMIN B COMPLEX W/B-12 PO TABS: ORAL_TABLET | ORAL | Status: AC

## 2023-12-08 NOTE — Progress Notes (Signed)
 I called her and let her know.  She is gonna take the medication.

## 2023-12-11 ENCOUNTER — Ambulatory Visit: Payer: Self-pay | Admitting: Psychology

## 2023-12-11 DIAGNOSIS — F332 Major depressive disorder, recurrent severe without psychotic features: Secondary | ICD-10-CM

## 2023-12-11 DIAGNOSIS — F4310 Post-traumatic stress disorder, unspecified: Secondary | ICD-10-CM

## 2023-12-11 DIAGNOSIS — F411 Generalized anxiety disorder: Secondary | ICD-10-CM

## 2023-12-11 DIAGNOSIS — F1090 Alcohol use, unspecified, uncomplicated: Secondary | ICD-10-CM

## 2023-12-19 NOTE — Progress Notes (Signed)
 Client: Shandora Koogler Date: 12/11/2023 Clinician: Charmaine Jasmine Duration: 60 minutes  Data: Client arrived several minutes late but was ready to begin once arrived. Session focused on the topic of redirection during conversations, as she often experiences tangential speech. Client agreed redirection would be helpful to obtain the most out of the sessions, but stated she would not be able to redirect herself. Clinician reassured client that the redirection would be led by the clinician. Client became emotional expressing feelings of hopelessness and exhaustion, stating "I didn't used to be like this". Client described being in a constant state of worry due to housing, causing her extreme difficulty focusing on other tasks. Clinician introduced the idea of writing down intrusive thoughts to discuss and process next session. Heightened anxiety was noted due to the incompletion of community service. Client expressed a need for disability services to aid in obtaining more secure housing, but worried about being perceived as "lazy". Reassured client Legal Aid was moving forward with this goal.    Assessment: Client presented with anxiety and frustration over current circumstances, relating to housing and financial insecurity. Affect was anxious and tearful at times, congruent with conveyed emotions. Client maintains good insight, and is motivated to continue services and improvements towards her overall health and well-being. Client continues to struggle with difficulties involving organization.    Plan: Continue to see client on a weekly basis to monitor emotional well-being, support Legal Aid collaboration, and stress reduction. Continue to strengthen thought-tracking and journaling techniques. Continue to support client to ensure community service requirements are met. Future sessions to focus on anxiety management techniques and maintaining focus during sessions.  MSW Intern, Charmaine Jasmine
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
## 2023-12-25 ENCOUNTER — Other Ambulatory Visit: Payer: Self-pay | Admitting: Psychology

## 2023-12-26 ENCOUNTER — Ambulatory Visit: Payer: Self-pay | Admitting: Psychology

## 2023-12-26 DIAGNOSIS — F411 Generalized anxiety disorder: Secondary | ICD-10-CM

## 2023-12-26 DIAGNOSIS — F4310 Post-traumatic stress disorder, unspecified: Secondary | ICD-10-CM

## 2023-12-26 DIAGNOSIS — F1090 Alcohol use, unspecified, uncomplicated: Secondary | ICD-10-CM

## 2023-12-26 DIAGNOSIS — F332 Major depressive disorder, recurrent severe without psychotic features: Secondary | ICD-10-CM

## 2024-01-07 ENCOUNTER — Ambulatory Visit (INDEPENDENT_AMBULATORY_CARE_PROVIDER_SITE_OTHER): Payer: Self-pay | Admitting: Psychology

## 2024-01-07 DIAGNOSIS — F4310 Post-traumatic stress disorder, unspecified: Secondary | ICD-10-CM

## 2024-01-07 DIAGNOSIS — F332 Major depressive disorder, recurrent severe without psychotic features: Secondary | ICD-10-CM

## 2024-01-07 DIAGNOSIS — F411 Generalized anxiety disorder: Secondary | ICD-10-CM

## 2024-01-07 DIAGNOSIS — F1090 Alcohol use, unspecified, uncomplicated: Secondary | ICD-10-CM

## 2024-01-07 NOTE — Progress Notes (Unsigned)
 Comprehensive Clinical Assessment (CCA) Note  01/07/2024 Tonya Le [id number]  Chief Complaint: No chief complaint on file.  Visit Diagnosis: ***    CCA Screening, Triage and Referral (STR)  Patient Reported Information How did you hear about us ? Dr. Adella Referral name: Dr. Adella Referral phone number: [phone]  Whom do you see for routine medical problems? Dr. Adella Practice/Facility Name: [facility] Phone Number: [phone] Name of Contact: Dr. Adella Pass Number: [phone] Contact Fax Number: N/A Prescriber Name: Dr. Adella Prescriber Address (if known): [address]  What Is the Reason for Your Visit/Call Today? No data recorded How Long Has This Been Causing You Problems? No data recorded What Do You Feel Would Help You the Most Today? No data recorded  Have You Recently Been in Any Inpatient Treatment (Hospital/Detox/Crisis Center/28-Day Program)? No data recorded Name/Location of Program/Hospital:No data recorded How Long Were You There? No data recorded When Were You Discharged? No data recorded  Have You Ever Received Services From [facility] Before? No data recorded Who Do You See at [facility]? No data recorded  Have You Recently Had Any Thoughts About Hurting Yourself? No data recorded Are You Planning to Commit Suicide/Harm Yourself At This time? No data recorded  Have you Recently Had Thoughts About Hurting Someone Sher[place]? No data recorded Explanation: No data recorded  Have You Used Any Alcohol or Drugs in the Past 24 Hours? No data recorded How Long Ago Did You Use Drugs or Alcohol? No data recorded What Did You Use and How Much? No data recorded  Do You Currently Have a Therapist/Psychiatrist? No data recorded Name of Therapist/Psychiatrist: No data recorded  Have You Been Recently Discharged From Any Office Practice or Programs? No data recorded Explanation of  Discharge From Practice/Program: No data recorded    CCA Screening Triage Referral Assessment Type of Contact: No data recorded Is this Initial or Reassessment? No data recorded Date Telepsych consult ordered in [facility]:  No data recorded Time Telepsych consult ordered in [facility]:  No data recorded  Patient Reported Information Reviewed? No data recorded Patient Left Without Being Seen? No data recorded Reason for Not Completing Assessment: No data recorded  Collateral Involvement: No data recorded  Does Patient Have a Court Appointed Legal Guardian? No data recorded Name and Contact of Legal Guardian: No data recorded If Minor and Not Living with Parent(s), Who has Custody? No data recorded Is CPS involved or ever been involved? No data recorded Is APS involved or ever been involved? No data recorded  Patient Determined To Be At Risk for Harm To Self or Others Based on Review of Patient Reported Information or Presenting Complaint? No data recorded Method: No data recorded Availability of Means: No data recorded Intent: No data recorded Notification Required: No data recorded Additional Information for Danger to Others Potential: No data recorded Additional Comments for Danger to Others Potential: No data recorded Are There Guns or Other Weapons in Your Home? No data recorded Types of Guns/Weapons: No data recorded Are These Weapons Safely Secured?                            No data recorded Who Could Verify You Are Able To Have These Secured: No data recorded Do You Have any Outstanding Charges, Pending Court Dates, Parole/Probation? No data recorded Contacted To Inform of Risk of Harm To Self or Others: No data recorded  Location of Assessment: No  data recorded  Does Patient Present under Involuntary Commitment? No data recorded IVC Papers Initial File Date: No data recorded  [place] of Residence: [place]  Patient Currently Receiving the Following Services:  Therapy  Determination of Need: No data recorded  Options For Referral: Legal Aid    CCA Biopsychosocial Intake/Chief Complaint:  No data recorded Current Symptoms/Problems: No data recorded  Patient Reported Schizophrenia/Schizoaffective Diagnosis in Past: No data recorded  Strengths: No data recorded Preferences: No data recorded Abilities: No data recorded  Type of Services Patient Feels are Needed: No data recorded  Initial Clinical Notes/Concerns: No data recorded  Mental Health Symptoms Depression:  No data recorded  Duration of Depressive symptoms: No data recorded  Mania:  No data recorded  Anxiety:   No data recorded  Psychosis:  No data recorded  Duration of Psychotic symptoms: No data recorded  Trauma:  No data recorded  Obsessions:  No data recorded  Compulsions:  No data recorded  Inattention:  No data recorded  Hyperactivity/Impulsivity:  No data recorded  Oppositional/Defiant Behaviors:  No data recorded  Emotional Irregularity:  No data recorded  Other Mood/Personality Symptoms:  No data recorded   Mental Status Exam Appearance and self-care  Stature:  No data recorded  Weight:  No data recorded  Clothing:  No data recorded  Grooming:  No data recorded  Cosmetic use:  No data recorded  Posture/gait:  No data recorded  Motor activity:  No data recorded  Sensorium  Attention:  No data recorded  Concentration:  No data recorded  Orientation:  No data recorded  Recall/memory:  No data recorded  Affect and Mood  Affect:  No data recorded  Mood:  No data recorded  Relating  Eye contact:  No data recorded  Facial expression:  No data recorded  Attitude toward examiner:  No data recorded  Thought and Language  Speech flow: No data recorded  Thought content:  No data recorded  Preoccupation:  No data recorded  Hallucinations:  No data recorded  Organization:  No data recorded  [employer] of Knowledge:  No data recorded   Intelligence:  No data recorded  Abstraction:  No data recorded  Judgement:  No data recorded  Reality Testing:  No data recorded  Insight:  No data recorded  Decision Making:  No data recorded  Social Functioning  Social Maturity:  No data recorded  Social Judgement:  No data recorded  Stress  Stressors:  No data recorded  Coping Ability:  No data recorded  Skill Deficits:  No data recorded  Supports:  No data recorded    Religion:    Leisure/Recreation:    Exercise/Diet:     CCA Employment/Education Employment/Work Situation:    Education:     CCA Family/Childhood History Family and Relationship History:    Childhood History:     Child/Adolescent Assessment:     CCA Substance Use Alcohol/Drug Use:                           ASAM's:  Six Dimensions of Multidimensional Assessment  Dimension 1:  Acute Intoxication and/or Withdrawal Potential:      Dimension 2:  Biomedical Conditions and Complications:      Dimension 3:  Emotional, Behavioral, or Cognitive Conditions and Complications:     Dimension 4:  Readiness to Change:     Dimension 5:  Relapse, Continued use, or Continued Problem Potential:  Dimension 6:  Recovery/Living Environment:     ASAM Severity Score:    ASAM Recommended Level of Treatment:     Substance use Disorder (SUD)    Recommendations for Services/Supports/Treatments:    DSM5 Diagnoses: Patient Active Problem List   Diagnosis Date Noted   Bipolar 1 disorder (HCC) 11/20/2023   Alcohol use disorder 11/20/2023   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   Substance abuse in remission (HCC) 04/23/2017   Nicotine dependence with current use 04/23/2017   PTSD (post-traumatic stress disorder) 04/23/2017   Encounter for birth control pills maintenance 07/11/2014   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   Eating disorder 07/11/2014    Patient Centered Plan: Patient is on the following Treatment Plan(s):   {[facility] AMB BH OP Treatment Plans:21091129}   Referrals to Alternative Service(s): Referred to Alternative Service(s):   Place:   Date:   Time:    Referred to Alternative Service(s):   Place:   Date:   Time:    Referred to Alternative Service(s):   Place:   Date:   Time:    Referred to Alternative Service(s):   Place:   Date:   Time:      Collaboration of Care: {BH OP Collaboration of Care:21014065}  Patient/Guardian was advised Release of Information must be obtained prior to any record release in order to collaborate their care with an outside provider. Patient/Guardian was advised if they have not already done so to contact the registration department to sign all necessary forms in order for us  to release information regarding their care.   Consent: Patient/Guardian gives verbal consent for treatment and assignment of benefits for services provided during this visit. Patient/Guardian expressed understanding and agreed to proceed.   Charmaine FORBES Jasmine, Student-Social Work

## 2024-01-12 ENCOUNTER — Ambulatory Visit: Payer: Self-pay | Admitting: Psychology

## 2024-01-12 DIAGNOSIS — F1090 Alcohol use, unspecified, uncomplicated: Secondary | ICD-10-CM

## 2024-01-12 DIAGNOSIS — F332 Major depressive disorder, recurrent severe without psychotic features: Secondary | ICD-10-CM | POA: Diagnosis not present

## 2024-01-12 DIAGNOSIS — F4310 Post-traumatic stress disorder, unspecified: Secondary | ICD-10-CM | POA: Diagnosis not present

## 2024-01-12 DIAGNOSIS — F411 Generalized anxiety disorder: Secondary | ICD-10-CM

## 2024-01-22 ENCOUNTER — Ambulatory Visit: Admitting: Psychology

## 2024-01-22 DIAGNOSIS — F411 Generalized anxiety disorder: Secondary | ICD-10-CM

## 2024-01-22 DIAGNOSIS — F1090 Alcohol use, unspecified, uncomplicated: Secondary | ICD-10-CM | POA: Diagnosis not present

## 2024-01-22 DIAGNOSIS — F332 Major depressive disorder, recurrent severe without psychotic features: Secondary | ICD-10-CM | POA: Diagnosis not present

## 2024-01-22 DIAGNOSIS — F4310 Post-traumatic stress disorder, unspecified: Secondary | ICD-10-CM

## 2024-02-11 ENCOUNTER — Ambulatory Visit: Payer: Self-pay | Admitting: Internal Medicine

## 2024-02-18 ENCOUNTER — Other Ambulatory Visit: Admitting: Psychology

## 2024-02-19 ENCOUNTER — Other Ambulatory Visit: Admitting: Psychology

## 2024-02-20 ENCOUNTER — Telehealth: Payer: Self-pay | Admitting: Internal Medicine

## 2024-02-20 NOTE — Telephone Encounter (Signed)
 Per SW patient needs an appointment next cancellation to be seen by PCP.
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
## 2024-02-25 ENCOUNTER — Other Ambulatory Visit: Payer: Self-pay | Admitting: Internal Medicine

## 2024-02-25 ENCOUNTER — Ambulatory Visit: Admitting: Internal Medicine

## 2024-02-25 VITALS — BP 140/80 | HR 76 | Resp 16 | Ht 63.0 in | Wt 138.0 lb

## 2024-02-25 DIAGNOSIS — R32 Unspecified urinary incontinence: Secondary | ICD-10-CM

## 2024-02-25 DIAGNOSIS — L290 Pruritus ani: Secondary | ICD-10-CM

## 2024-02-25 DIAGNOSIS — J029 Acute pharyngitis, unspecified: Secondary | ICD-10-CM

## 2024-02-25 DIAGNOSIS — R300 Dysuria: Secondary | ICD-10-CM

## 2024-02-25 DIAGNOSIS — Z124 Encounter for screening for malignant neoplasm of cervix: Secondary | ICD-10-CM

## 2024-02-25 DIAGNOSIS — A630 Anogenital (venereal) warts: Secondary | ICD-10-CM

## 2024-02-25 LAB — POCT WET PREP WITH KOH
Clue Cells Wet Prep HPF POC: NEGATIVE
KOH Prep POC: NEGATIVE
Trichomonas, UA: NEGATIVE
Yeast Wet Prep HPF POC: NEGATIVE

## 2024-02-25 LAB — POCT RAPID STREP A (OFFICE): Rapid Strep A Screen: NEGATIVE

## 2024-02-25 LAB — POCT INFLUENZA A/B
Influenza A, POC: NEGATIVE
Influenza B, POC: NEGATIVE

## 2024-02-25 LAB — POC COVID19 BINAXNOW: SARS Coronavirus 2 Ag: NEGATIVE

## 2024-02-25 MED ORDER — IBUPROFEN 200 MG PO TABS: ORAL_TABLET | ORAL | Status: AC
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
## 2024-02-25 NOTE — Telephone Encounter (Signed)
 Patient has been seen.

## 2024-02-26 ENCOUNTER — Ambulatory Visit (INDEPENDENT_AMBULATORY_CARE_PROVIDER_SITE_OTHER): Admitting: Psychology

## 2024-02-26 DIAGNOSIS — F411 Generalized anxiety disorder: Secondary | ICD-10-CM

## 2024-02-26 DIAGNOSIS — F4310 Post-traumatic stress disorder, unspecified: Secondary | ICD-10-CM

## 2024-02-26 DIAGNOSIS — F1090 Alcohol use, unspecified, uncomplicated: Secondary | ICD-10-CM

## 2024-02-26 DIAGNOSIS — F332 Major depressive disorder, recurrent severe without psychotic features: Secondary | ICD-10-CM | POA: Diagnosis not present

## 2024-02-26 LAB — HEPATITIS C ANTIBODY: Hep C Virus Ab: NONREACTIVE

## 2024-02-26 LAB — HIV ANTIBODY (ROUTINE TESTING W REFLEX): HIV Screen 4th Generation wRfx: NONREACTIVE

## 2024-02-26 LAB — CYTOLOGY - PAP

## 2024-02-26 LAB — SYPHILIS: RPR WITH REFLEX TO RPR TITER: RPR Ser Ql: NONREACTIVE

## 2024-02-28 LAB — GC/CHLAMYDIA PROBE AMP
Chlamydia trachomatis, NAA: NEGATIVE
Chlamydia trachomatis, NAA: NEGATIVE
Neisseria Gonorrhoeae by PCR: POSITIVE — AB
Neisseria Gonorrhoeae by PCR: POSITIVE — AB

## 2024-02-29 ENCOUNTER — Other Ambulatory Visit: Payer: Self-pay

## 2024-02-29 ENCOUNTER — Encounter (HOSPITAL_COMMUNITY): Payer: Self-pay

## 2024-02-29 ENCOUNTER — Encounter: Payer: Self-pay | Admitting: Internal Medicine

## 2024-02-29 ENCOUNTER — Telehealth: Payer: Self-pay | Admitting: Internal Medicine

## 2024-02-29 ENCOUNTER — Ambulatory Visit: Payer: Self-pay | Admitting: Internal Medicine

## 2024-02-29 ENCOUNTER — Emergency Department (HOSPITAL_COMMUNITY)

## 2024-02-29 ENCOUNTER — Emergency Department (HOSPITAL_COMMUNITY)
Admission: EM | Admit: 2024-02-29 | Discharge: 2024-02-29 | Disposition: A | Discharge status: Home / Self Care | Attending: Emergency Medicine | Admitting: Emergency Medicine

## 2024-02-29 DIAGNOSIS — K529 Noninfective gastroenteritis and colitis, unspecified: Secondary | ICD-10-CM | POA: Insufficient documentation

## 2024-02-29 DIAGNOSIS — L290 Pruritus ani: Secondary | ICD-10-CM | POA: Insufficient documentation

## 2024-02-29 DIAGNOSIS — T7621XA Adult sexual abuse, suspected, initial encounter: Secondary | ICD-10-CM | POA: Insufficient documentation

## 2024-02-29 DIAGNOSIS — A630 Anogenital (venereal) warts: Secondary | ICD-10-CM | POA: Insufficient documentation

## 2024-02-29 DIAGNOSIS — A549 Gonococcal infection, unspecified: Secondary | ICD-10-CM | POA: Insufficient documentation

## 2024-02-29 LAB — URINALYSIS, ROUTINE W REFLEX MICROSCOPIC
Bacteria, UA: NONE SEEN
Bilirubin Urine: NEGATIVE
Glucose, UA: NEGATIVE mg/dL
Ketones, ur: NEGATIVE mg/dL
Nitrite: NEGATIVE
Protein, ur: NEGATIVE mg/dL
Specific Gravity, Urine: 1.032 — ABNORMAL HIGH (ref 1.005–1.030)
pH: 6 (ref 5.0–8.0)

## 2024-02-29 LAB — CBC
HCT: 46.2 % — ABNORMAL HIGH (ref 36.0–46.0)
Hemoglobin: 15.9 g/dL — ABNORMAL HIGH (ref 12.0–15.0)
MCH: 34 pg (ref 26.0–34.0)
MCHC: 34.4 g/dL (ref 30.0–36.0)
MCV: 98.7 fL (ref 80.0–100.0)
Platelets: 178 10*3/uL (ref 150–400)
RBC: 4.68 MIL/uL (ref 3.87–5.11)
RDW: 13 % (ref 11.5–15.5)
WBC: 25 10*3/uL — ABNORMAL HIGH (ref 4.0–10.5)
nRBC: 0 % (ref 0.0–0.2)

## 2024-02-29 LAB — COMPREHENSIVE METABOLIC PANEL WITH GFR
ALT: 11 U/L (ref 0–44)
AST: 21 U/L (ref 15–41)
Albumin: 4.1 g/dL (ref 3.5–5.0)
Alkaline Phosphatase: 104 U/L (ref 38–126)
Anion gap: 17 — ABNORMAL HIGH (ref 5–15)
BUN: 16 mg/dL (ref 6–20)
CO2: 22 mmol/L (ref 22–32)
Calcium: 9.9 mg/dL (ref 8.9–10.3)
Chloride: 93 mmol/L — ABNORMAL LOW (ref 98–111)
Creatinine, Ser: 1.01 mg/dL — ABNORMAL HIGH (ref 0.44–1.00)
GFR, Estimated: >60 mL/min
Glucose, Bld: 152 mg/dL — ABNORMAL HIGH (ref 70–99)
Potassium: 3.4 mmol/L — ABNORMAL LOW (ref 3.5–5.1)
Sodium: 132 mmol/L — ABNORMAL LOW (ref 135–145)
Total Bilirubin: 0.7 mg/dL (ref 0.0–1.2)
Total Protein: 7.9 g/dL (ref 6.5–8.1)

## 2024-02-29 LAB — URINE CULTURE

## 2024-02-29 LAB — LIPASE, BLOOD: Lipase: 14 U/L (ref 11–51)

## 2024-02-29 LAB — HCG, SERUM, QUALITATIVE: Preg, Serum: NEGATIVE

## 2024-02-29 MED ORDER — SODIUM CHLORIDE 0.9 % IV SOLN
2.0000 g | Freq: Once | INTRAVENOUS | Status: AC
  Administered 2024-02-29: 2 g via INTRAVENOUS
  Filled 2024-02-29: qty 20

## 2024-02-29 MED ORDER — ONDANSETRON HCL 4 MG PO TABS: 4.0000 mg | ORAL_TABLET | Freq: Three times a day (TID) | ORAL | 0 refills | Status: AC | PRN

## 2024-02-29 MED ORDER — MORPHINE SULFATE (PF) 4 MG/ML IV SOLN
4.0000 mg | Freq: Once | INTRAVENOUS | Status: AC
  Administered 2024-02-29: 4 mg via INTRAVENOUS
  Filled 2024-02-29: qty 1

## 2024-02-29 MED ORDER — SODIUM CHLORIDE 0.9 % IV BOLUS
1000.0000 mL | Freq: Once | INTRAVENOUS | Status: AC
  Administered 2024-02-29: 1000 mL via INTRAVENOUS

## 2024-02-29 MED ORDER — ONDANSETRON HCL 4 MG/2ML IJ SOLN
4.0000 mg | Freq: Once | INTRAMUSCULAR | Status: AC
  Administered 2024-02-29: 4 mg via INTRAVENOUS
  Filled 2024-02-29: qty 2

## 2024-02-29 MED ORDER — METRONIDAZOLE 500 MG/100ML IV SOLN
500.0000 mg | Freq: Once | INTRAVENOUS | Status: AC
  Administered 2024-02-29: 500 mg via INTRAVENOUS
  Filled 2024-02-29: qty 100

## 2024-02-29 MED ORDER — IOHEXOL 300 MG/ML  SOLN
100.0000 mL | Freq: Once | INTRAMUSCULAR | Status: AC | PRN
  Administered 2024-02-29: 100 mL via INTRAVENOUS
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
## 2024-02-29 NOTE — Discharge Instructions (Signed)
 You were seen in the emergency department for abdominal pain Your pregnancy test was negative Your CT was most consistent with a viral GI infection We have treated you for gonorrhea with antibiotics here 1 dose We have called in a prescription for Zofran  You spoke with our case management nurse Corean who has provided you with resources Please come back to the emergency department if you are feeling worse or feeling unsafe at home

## 2024-02-29 NOTE — ED Triage Notes (Signed)
 BIBA for LLQ abdominal pain, blood in urine since last night- recently dx w/ gonorrhea, hx of gallstones. 126/80 bp 122 hr 133 cbg

## 2024-02-29 NOTE — Telephone Encounter (Signed)
 Called Tonya Le as her STI work up returned today showing + for gonorrhea and urine showed low count for Group B strep.  The gonorrhea was positive for both endocervical and throat swabs, the throat swab performed as she was also having a sore throat along with dysuria.  She did not have vaginal discharge on exam on the 21st, just scant menstrual flow.    Difficulty understanding her--slurring and upset--she denies alcohol intake in past 24 hours, but states drinking water .   She is now having significant lower abdominal pain and diarrhea and feels she will pass out.   She is planning to go to ED for evaluation and treatment.  Encouraged her not to drive in current weather, but to call EMS. She will likely need separate treatment for the Group B strep with penicillin as can have resistance to Ceftriaxone .  Rest of STI testing negative:  RPR, HIV, chlamydia. With sore throat, was tested for influenza, rapid strep, COVID:  all negative.  Patient was seen by LCSW in office on Thursday, the 22nd and looked well then.

## 2024-03-02 LAB — GC/CHLAMYDIA PROBE AMP (~~LOC~~) NOT AT ARMC
Chlamydia: NEGATIVE
Comment: NEGATIVE
Comment: NORMAL
Neisseria Gonorrhea: POSITIVE — AB
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
## 2024-03-04 ENCOUNTER — Ambulatory Visit (HOSPITAL_COMMUNITY): Payer: Self-pay

## 2024-03-04 ENCOUNTER — Other Ambulatory Visit: Admitting: Psychology
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
## 2024-03-04 NOTE — Progress Notes (Signed)
 Client: Tonya Le Date: 02/26/2023 Clinician: Charmaine Jasmine, MSW Intern Duration: 60 minutes   Data: Client reported several minutes late wearing unsuitable clothing for the winter season. Affect was mood congruent, reporting she received a disheartening and upsetting text message from her fathers girlfriend, but did not respond. Client reported increased worry and anxiety relating to current living situation and the individual involved. Described symptoms include heightened anger, depression, nightmares, and anxiety. Client described a step-by-step plan on how to ruin the individual's life that is tightly linked to her housing situation. Client denied needing to resort to this plan at this current time. No self-harm or harm to others is involved in this plan. Client became emotionally aggressive, began to cry, raised voice, and broke ink pen against notebook she was using when discussing the individual involved in her housing. Clinician began the conversation of removing the client from her housing situation and introduce the idea of short-term shelters. Client was informed that her three dogs would more than likely not be allowed and the client immediately shut the idea down. Client denied wanting to be removed from her housing crisis due to the close relationship she has with her dogs. Clinician allowed for the session to be moved outside to deescalate the behavior. Client reported continued misuse of alcohol. Client denied suicidal or homicidal ideation.  Assessment: Client presents with major symptoms of anxiety, depression, PTSD, and OCD. Clients symptoms appear to be chronic and tightly linked to her housing situation and past traumas. Client continues to struggle with maintaining a consistent sleep schedule, mainly due to misuse of alcohol. Insight is fair, as she is aware she needs assistance and has limited protective factors. Client remains invested in receiving counseling and case  management services. Clinician never felt unsafe during this session.   Plan: Continue to see client on a weekly basis. Client and clinician have colloborated and since received Medicaid for the client. Look into potentially switching to Medicaid types that prioritize mental health. Continue to watch for messages and mail received involving qualifying for disability in order to complete this goal. Actively monitor status and aid in any way necessary regarding establishing care for medication management with a psychiatrist. Supervisor notified of behaviors during this session. Priortize creating a safety plan for next session.   MSW Intern, Charmaine Jasmine

## 2024-03-11 ENCOUNTER — Other Ambulatory Visit: Admitting: Psychology

## 2024-06-22 ENCOUNTER — Encounter: Payer: Self-pay | Admitting: Internal Medicine
